# Patient Record
Sex: Male | Born: 1965 | Race: White | Hispanic: No | Marital: Married
Health system: Midwestern US, Academic
[De-identification: ages and names within clinical notes are randomized; demographics above are authoritative.]

---

## 2016-12-12 MED ORDER — SODIUM CHLORIDE 0.9 % IV SOLP
250 mL | INTRAVENOUS | 0 refills | Status: AC | PRN
Start: 2016-12-12 — End: ?

## 2016-12-12 MED ORDER — REGADENOSON 0.4 MG/5 ML IV SYRG
.4 mg | Freq: Once | INTRAVENOUS | 0 refills | Status: CP
Start: 2016-12-12 — End: ?

## 2016-12-12 MED ORDER — ALBUTEROL SULFATE 90 MCG/ACTUATION IN HFAA
2 | RESPIRATORY_TRACT | 0 refills | Status: DC | PRN
Start: 2016-12-12 — End: 2016-12-17

## 2016-12-12 MED ORDER — NITROGLYCERIN 0.4 MG SL SUBL
.4 mg | SUBLINGUAL | 0 refills | Status: AC | PRN
Start: 2016-12-12 — End: ?

## 2016-12-12 MED ORDER — AMINOPHYLLINE 500 MG/20 ML IV SOLN
50 mg | INTRAVENOUS | 0 refills | Status: AC | PRN
Start: 2016-12-12 — End: ?

## 2017-04-07 LAB — COMPREHENSIVE METABOLIC PANEL
Lab: 0.4
Lab: 138 — ABNORMAL LOW (ref 4.70–6.10)
Lab: 21
Lab: 22
Lab: 4
Lab: 68
Lab: 7.3
Lab: 72

## 2017-04-07 LAB — THYROID STIMULATING HORMONE-TSH: Lab: 1.2 — ABNORMAL HIGH (ref 30–200)

## 2017-04-07 LAB — LIPID PROFILE
Lab: 23 — ABNORMAL LOW (ref 35–60)
Lab: 38 — ABNORMAL HIGH (ref 27.0–31.0)
Lab: 4
Lab: 45 — ABNORMAL HIGH (ref 5–40)
Lab: 95 — ABNORMAL LOW (ref 150–200)

## 2017-07-13 ENCOUNTER — Encounter: Admit: 2017-07-13 | Discharge: 2017-07-13 | Payer: MEDICARE

## 2017-07-13 ENCOUNTER — Ambulatory Visit: Admit: 2017-07-13 | Discharge: 2017-07-14 | Payer: MEDICARE

## 2017-07-13 DIAGNOSIS — I2510 Atherosclerotic heart disease of native coronary artery without angina pectoris: Principal | ICD-10-CM

## 2017-07-13 DIAGNOSIS — E785 Hyperlipidemia, unspecified: ICD-10-CM

## 2017-07-13 DIAGNOSIS — E349 Endocrine disorder, unspecified: ICD-10-CM

## 2017-07-13 DIAGNOSIS — D499 Neoplasm of unspecified behavior of unspecified site: Principal | ICD-10-CM

## 2017-07-13 DIAGNOSIS — I10 Essential (primary) hypertension: ICD-10-CM

## 2017-07-13 DIAGNOSIS — E039 Hypothyroidism, unspecified: ICD-10-CM

## 2017-07-13 DIAGNOSIS — R9439 Abnormal result of other cardiovascular function study: ICD-10-CM

## 2017-07-13 DIAGNOSIS — Z720 Tobacco use: ICD-10-CM

## 2017-12-07 ENCOUNTER — Encounter: Admit: 2017-12-07 | Discharge: 2017-12-07 | Payer: MEDICARE

## 2017-12-07 DIAGNOSIS — E785 Hyperlipidemia, unspecified: ICD-10-CM

## 2017-12-07 DIAGNOSIS — Z720 Tobacco use: ICD-10-CM

## 2017-12-07 DIAGNOSIS — R9439 Abnormal result of other cardiovascular function study: ICD-10-CM

## 2017-12-07 DIAGNOSIS — I2510 Atherosclerotic heart disease of native coronary artery without angina pectoris: ICD-10-CM

## 2017-12-07 DIAGNOSIS — D499 Neoplasm of unspecified behavior of unspecified site: Principal | ICD-10-CM

## 2017-12-07 DIAGNOSIS — I10 Essential (primary) hypertension: ICD-10-CM

## 2017-12-07 DIAGNOSIS — E349 Endocrine disorder, unspecified: ICD-10-CM

## 2017-12-07 DIAGNOSIS — E039 Hypothyroidism, unspecified: ICD-10-CM

## 2018-03-13 LAB — LIPID PROFILE
Lab: 112 — ABNORMAL LOW (ref 150–200)
Lab: 406 — ABNORMAL HIGH (ref 30–200)

## 2018-03-13 LAB — CBC: Lab: 11 — ABNORMAL HIGH (ref 4.8–10.8)

## 2018-03-13 LAB — HEMOGLOBIN A1C: Lab: 8.3 — ABNORMAL HIGH (ref 4.5–6.5)

## 2018-03-13 LAB — COMPREHENSIVE METABOLIC PANEL: Lab: 137 — ABNORMAL LOW (ref 4.70–6.10)

## 2018-06-21 ENCOUNTER — Encounter: Admit: 2018-06-21 | Discharge: 2018-06-21 | Payer: MEDICARE

## 2018-06-21 ENCOUNTER — Ambulatory Visit: Admit: 2018-06-21 | Discharge: 2018-06-22 | Payer: MEDICARE

## 2018-06-21 DIAGNOSIS — I2510 Atherosclerotic heart disease of native coronary artery without angina pectoris: Principal | ICD-10-CM

## 2018-06-21 DIAGNOSIS — I10 Essential (primary) hypertension: ICD-10-CM

## 2018-06-21 DIAGNOSIS — E039 Hypothyroidism, unspecified: ICD-10-CM

## 2018-06-21 DIAGNOSIS — R9439 Abnormal result of other cardiovascular function study: ICD-10-CM

## 2018-06-21 DIAGNOSIS — E785 Hyperlipidemia, unspecified: ICD-10-CM

## 2018-06-21 DIAGNOSIS — D499 Neoplasm of unspecified behavior of unspecified site: Principal | ICD-10-CM

## 2018-06-21 DIAGNOSIS — Z720 Tobacco use: ICD-10-CM

## 2018-06-21 DIAGNOSIS — E349 Endocrine disorder, unspecified: ICD-10-CM

## 2019-02-05 ENCOUNTER — Ambulatory Visit: Admit: 2019-02-05 | Discharge: 2019-02-06

## 2019-02-05 ENCOUNTER — Encounter: Admit: 2019-02-05 | Discharge: 2019-02-05

## 2019-02-05 DIAGNOSIS — E039 Hypothyroidism, unspecified: Secondary | ICD-10-CM

## 2019-02-05 DIAGNOSIS — Z720 Tobacco use: Secondary | ICD-10-CM

## 2019-02-05 DIAGNOSIS — I2510 Atherosclerotic heart disease of native coronary artery without angina pectoris: Secondary | ICD-10-CM

## 2019-02-05 DIAGNOSIS — E785 Hyperlipidemia, unspecified: Secondary | ICD-10-CM

## 2019-02-05 DIAGNOSIS — E349 Endocrine disorder, unspecified: Secondary | ICD-10-CM

## 2019-02-05 DIAGNOSIS — I10 Essential (primary) hypertension: Secondary | ICD-10-CM

## 2019-02-05 DIAGNOSIS — R9439 Abnormal result of other cardiovascular function study: Secondary | ICD-10-CM

## 2019-02-05 DIAGNOSIS — D499 Neoplasm of unspecified behavior of unspecified site: Secondary | ICD-10-CM

## 2019-02-05 NOTE — Progress Notes
Date of Service: 02/05/2019    Jeremy Burnett is a 53 y.o. male.       HPI     I saw Mr. Pharo regarding his coronary artery disease, previous stent angioplasty.  He feels well at the current time.  He continues to struggle with his morbid obesity, hypertension, and hyperlipidemia.  Today, he is wearing a mask.  His last stent was in 2016, a drug-eluting stent to the right coronary artery.     Otherwise, he tells me he feels very well and is not having symptomatic complaints.  I will see him again in late September.    ([id number])             Vitals:    02/05/19 0830   BP: 110/72   BP Source: Arm, Left Upper   Pulse: 68   SpO2: 98%   Weight: (!) 138.3 kg (305 lb)   Height: 1.753 m (5' 9)   PainSc: Zero     Body mass index is 45.04 kg/m???.     Past Medical History  Patient Active Problem List    Diagnosis Date Noted   ??? Abnormal stress test 06/19/2015   ??? Hypothyroidism 06/19/2015   ??? CAD (coronary artery disease) 06/19/2015   ??? CAD (coronary artery disease), native coronary artery 05/31/2007     06/19/2015 cardiac cath - DES to RCA      ??? Essential hypertension 05/31/2007   ??? Hyperlipidemia 05/31/2007   ??? DM (diabetes mellitus) (HCC) 05/31/2007   ??? Morbid obesity with BMI of 45.0-49.9, adult (HCC) 05/31/2007   ??? Tobacco abuse 05/31/2007         Review of Systems   Constitution: Negative.   HENT: Negative.    Eyes: Negative.    Cardiovascular: Negative.    Respiratory: Negative.    Endocrine: Negative.    Hematologic/Lymphatic: Negative.    Skin: Negative.    Musculoskeletal: Negative.    Gastrointestinal: Negative.    Genitourinary: Negative.    Neurological: Negative.    Psychiatric/Behavioral: Negative.    Allergic/Immunologic: Negative.        Physical Exam  Examination reveals a 53 year old gentleman who is obese.  He is wearing a mask.  His head neck exam is benign.  Cranial nerve exam is negative.  Affect is normal.  Gait is normal.  Muscle strength is normal.  He has a slight limp from his knees.  Otherwise, he is ambulatory.  He does not smell of tobacco today.    ([id number])        Cardiovascular Studies      Problems Addressed Today  No diagnosis found.    Assessment and Plan     In summary, this is a 53 year old gentleman with known coronary artery disease.  He is on Lipitor already.  He is going to have his blood work to follow his liver function tests and lipid profile at that time.  I would like to see him back in September to review his risk factor modification.    ([id number])             Current Medications (including today's revisions)  ??? acetaminophen/codeine (TYLENOL #3) 300/30 mg tablet Take 1 tablet by mouth twice daily. Max of 4,000 mg of acetaminophen in 24 hours.   ??? ammonium lactate(+) (LAC-HYDRIN) 12 % topical cream Apply  topically to affected area twice daily.   ??? aspirin EC 81 mg tablet Take 1 Tab by mouth Every Morning.    ???  atorvastatin (LIPITOR) 40 mg tablet Take 40 mg by mouth daily.   ??? diphenhydrAMINE (BENADRYL) 25 mg capsule Take 2 Caps by mouth At Bedtime as needed For sleep   ??? duloxetine DR (CYMBALTA) 30 mg capsule Take 30 mg by mouth at bedtime daily.   ??? FISH OIL 1,000 mg Cap Take 1 capsule by mouth as Needed.   ??? insulin glargine (LANTUS SOLOSTAR) 100 unit/mL (3 mL) injection PEN Inject 50 Units under the skin every morning.   ??? levothyroxine (SYNTHROID) 25 mcg tablet Take 25 mcg by mouth daily 30 minutes before breakfast.   ??? lisinopril (PRINIVIL; ZESTRIL) 10 mg tablet Take 10 mg by mouth daily.   ??? metFORMIN (GLUCOPHAGE) 1,000 mg tablet Take 1 Tab by mouth three times daily.   ??? metoprolol (LOPRESSOR) 25 mg tablet Take 1 tablet by mouth daily.   ??? nitroglycerin (NITROSTAT) 0.4 mg tablet Place 1 Tab under tongue every 5 minutes as needed for Chest Pain. Max of 3 tablets, call 911.   ??? Potassium Gluconate 595 mg (99 mg) tab Take  by mouth as Needed.   ??? triamterene/hydrochlorothiazide (MAXZIDE) 37.5/25 mg tablet Take 1 Tab by mouth Every Morning.    ??? zolpidem (AMBIEN) 10 mg tablet Take 10 mg by mouth at bedtime as needed for Sleep.

## 2019-05-09 ENCOUNTER — Encounter: Admit: 2019-05-09 | Discharge: 2019-05-09

## 2019-05-14 ENCOUNTER — Encounter: Admit: 2019-05-14 | Discharge: 2019-05-14

## 2019-05-14 DIAGNOSIS — I10 Essential (primary) hypertension: Secondary | ICD-10-CM

## 2019-05-14 DIAGNOSIS — Z720 Tobacco use: Secondary | ICD-10-CM

## 2019-05-14 DIAGNOSIS — I2510 Atherosclerotic heart disease of native coronary artery without angina pectoris: Secondary | ICD-10-CM

## 2019-05-14 DIAGNOSIS — E785 Hyperlipidemia, unspecified: Secondary | ICD-10-CM

## 2019-05-14 DIAGNOSIS — E349 Endocrine disorder, unspecified: Secondary | ICD-10-CM

## 2019-05-14 DIAGNOSIS — D499 Neoplasm of unspecified behavior of unspecified site: Secondary | ICD-10-CM

## 2019-05-14 DIAGNOSIS — R9439 Abnormal result of other cardiovascular function study: Secondary | ICD-10-CM

## 2019-05-14 DIAGNOSIS — E039 Hypothyroidism, unspecified: Secondary | ICD-10-CM

## 2019-06-05 ENCOUNTER — Encounter: Admit: 2019-06-05 | Discharge: 2019-06-05 | Payer: MEDICARE

## 2019-06-05 DIAGNOSIS — D499 Neoplasm of unspecified behavior of unspecified site: Secondary | ICD-10-CM

## 2019-06-05 DIAGNOSIS — E039 Hypothyroidism, unspecified: Secondary | ICD-10-CM

## 2019-06-05 DIAGNOSIS — E349 Endocrine disorder, unspecified: Secondary | ICD-10-CM

## 2019-06-05 DIAGNOSIS — Z720 Tobacco use: Secondary | ICD-10-CM

## 2019-06-05 DIAGNOSIS — I10 Essential (primary) hypertension: Secondary | ICD-10-CM

## 2019-06-05 DIAGNOSIS — R9439 Abnormal result of other cardiovascular function study: Secondary | ICD-10-CM

## 2019-06-05 DIAGNOSIS — I2510 Atherosclerotic heart disease of native coronary artery without angina pectoris: Secondary | ICD-10-CM

## 2019-06-05 DIAGNOSIS — E785 Hyperlipidemia, unspecified: Secondary | ICD-10-CM

## 2019-06-05 DIAGNOSIS — M542 Cervicalgia: Secondary | ICD-10-CM
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
## 2019-06-12 NOTE — Patient Instructions
Notify our office when and where you have completed your MRI C spine.      Lelon Mast RN, BSN  Clinical Nurse Coordinator-[facility] - [facility] - Neurosurgery- Dr. Burman Freestone & Dr Mancel Parsons  Ph: [phone] - Fax: [phone] - Email:[email or web address] Pierini

## 2019-06-13 NOTE — Telephone Encounter
Patient requesting MRI C spine w/o contrast to be done at [facility] faxed to PCP office.

## 2019-06-14 ENCOUNTER — Encounter: Admit: 2019-06-14 | Discharge: 2019-06-14 | Payer: MEDICARE

## 2019-06-14 NOTE — Telephone Encounter
[facility] called [facility] is needing a prior authorization, patient informed nurse is not here today.      MA will route to [facility] for further review.

## 2019-06-26 ENCOUNTER — Ambulatory Visit: Admit: 2019-06-26 | Discharge: 2019-06-26 | Payer: MEDICARE

## 2019-06-26 ENCOUNTER — Encounter: Admit: 2019-06-26 | Discharge: 2019-06-26 | Payer: MEDICARE

## 2019-06-26 DIAGNOSIS — I8390 Asymptomatic varicose veins of unspecified lower extremity: Secondary | ICD-10-CM

## 2019-06-26 DIAGNOSIS — I10 Essential (primary) hypertension: Secondary | ICD-10-CM

## 2019-06-26 DIAGNOSIS — R112 Nausea with vomiting, unspecified: Secondary | ICD-10-CM

## 2019-06-26 DIAGNOSIS — M2550 Pain in unspecified joint: Secondary | ICD-10-CM

## 2019-06-26 DIAGNOSIS — N200 Calculus of kidney: Secondary | ICD-10-CM

## 2019-06-26 DIAGNOSIS — R9439 Abnormal result of other cardiovascular function study: Secondary | ICD-10-CM

## 2019-06-26 DIAGNOSIS — Z720 Tobacco use: Secondary | ICD-10-CM

## 2019-06-26 DIAGNOSIS — E039 Hypothyroidism, unspecified: Secondary | ICD-10-CM

## 2019-06-26 DIAGNOSIS — C801 Malignant (primary) neoplasm, unspecified: Secondary | ICD-10-CM

## 2019-06-26 DIAGNOSIS — G971 Other reaction to spinal and lumbar puncture: Secondary | ICD-10-CM

## 2019-06-26 DIAGNOSIS — E785 Hyperlipidemia, unspecified: Secondary | ICD-10-CM

## 2019-06-26 DIAGNOSIS — N189 Chronic kidney disease, unspecified: Secondary | ICD-10-CM

## 2019-06-26 DIAGNOSIS — I2510 Atherosclerotic heart disease of native coronary artery without angina pectoris: Secondary | ICD-10-CM

## 2019-06-26 DIAGNOSIS — K589 Irritable bowel syndrome without diarrhea: Secondary | ICD-10-CM

## 2019-06-26 DIAGNOSIS — E119 Type 2 diabetes mellitus without complications: Secondary | ICD-10-CM

## 2019-06-26 DIAGNOSIS — I219 Acute myocardial infarction, unspecified: Secondary | ICD-10-CM

## 2019-06-26 DIAGNOSIS — I209 Angina pectoris, unspecified: Secondary | ICD-10-CM

## 2019-06-26 DIAGNOSIS — M47812 Spondylosis without myelopathy or radiculopathy, cervical region: Secondary | ICD-10-CM

## 2019-06-26 DIAGNOSIS — K5732 Diverticulitis of large intestine without perforation or abscess without bleeding: Secondary | ICD-10-CM

## 2019-06-26 DIAGNOSIS — D499 Neoplasm of unspecified behavior of unspecified site: Secondary | ICD-10-CM

## 2019-06-26 DIAGNOSIS — E349 Endocrine disorder, unspecified: Secondary | ICD-10-CM

## 2019-06-26 NOTE — Progress Notes
Date of Service: 06/26/2019     Subjective:               History of Present Illness    Jeremy Burnett is a 53 y.o. male with a history of multiple left shoulder dislocations. He states that it continues to sublux posteriorly frequently. He also endorses numbness in his bilateral 4th and 5th digits for the past 6 months. He feels that his left hand is weaker than the right. He occasionally experiences pain that radiates from his left shoulder down to his left hand which improves with pain medications. He denies lower extremity weakness and numbness. Denies bowel and bladder concerns.       Review of Systems   Constitutional: Negative.    HENT: Negative.    Eyes: Negative.    Respiratory: Negative.    Cardiovascular: Negative.    Gastrointestinal: Negative.    Endocrine: Negative.    Genitourinary: Negative.    Musculoskeletal: Positive for arthralgias and joint swelling.   Skin: Negative.    Allergic/Immunologic: Negative.    Neurological: Negative.    Hematological: Negative.    Psychiatric/Behavioral: Negative.          Objective:         ? acetaminophen/codeine (TYLENOL #3) 300/30 mg tablet Take 1 tablet by mouth twice daily. Max of 4,000 mg of acetaminophen in 24 hours.   ? ammonium lactate(+) (LAC-HYDRIN) 12 % topical cream Apply  topically to affected area twice daily.   ? aspirin EC 81 mg tablet Take 1 Tab by mouth Every Morning.    ? atorvastatin (LIPITOR) 40 mg tablet Take 40 mg by mouth daily.   ? cinnamon bark (CINNAMON PO) Take  by mouth.   ? cyclobenzaprine (FLEXERIL) 10 mg tablet 10 mg.   ? diphenhydrAMINE (BENADRYL) 25 mg capsule Take 2 Caps by mouth At Bedtime as needed For sleep   ? duloxetine DR (CYMBALTA) 30 mg capsule Take 30 mg by mouth at bedtime daily.   ? FISH OIL 1,000 mg Cap Take 1 capsule by mouth as Needed.   ? HYDROcodone/acetaminophen (NORCO) 10/325 mg tablet 1 tablet   ? insulin glargine (LANTUS SOLOSTAR) 100 unit/mL (3 mL) injection PEN Inject 60 Units under the skin every morning.   ? levothyroxine (SYNTHROID) 25 mcg tablet Take 25 mcg by mouth daily 30 minutes before breakfast.   ? lisinopril (PRINIVIL; ZESTRIL) 10 mg tablet Take 10 mg by mouth daily.   ? metFORMIN (GLUCOPHAGE) 1,000 mg tablet Take 1 Tab by mouth three times daily.   ? metoprolol (LOPRESSOR) 25 mg tablet Take 1 tablet by mouth daily.   ? nitroglycerin (NITROSTAT) 0.4 mg tablet Place 1 Tab under tongue every 5 minutes as needed for Chest Pain. Max of 3 tablets, call 911.   ? Potassium Gluconate 595 mg (99 mg) tab Take  by mouth as Needed.   ? triamterene/hydrochlorothiazide (MAXZIDE) 37.5/25 mg tablet Take 1 Tab by mouth Every Morning.    ? zolpidem (AMBIEN) 10 mg tablet Take 10 mg by mouth at bedtime as needed for Sleep.     Vitals:    06/26/19 1004   Weight: (!) 139.7 kg (308 lb)   Height: 175.3 cm (69)   PainSc: Zero     Body mass index is 45.48 kg/m?Marland Kitchen       Physical Exam  Neurologic Exam:  Mental status: awake and alert, appropriate affect  Motor: no pronator drift, motor strength 5/5 at hand  grips, and on flexion and extension at the biceps, triceps, shoulders, hips, knees and ankles.  Sensory: diminished light touch in the bilateral 4th and 5th digits, otherwise intact  Reflexes: deep tendon reflexes are 2+ and equal bilaterally at the biceps, brachioradialis, patellar, and Achilles. No clonus, no Hoffmans         Assessment and Plan:  Jeremy Burnett presents with a chief complaint of shoulder subluxation. His MRI C-spine shows degenerative changes with some foraminal stenosis greater on the right side at C5/6 and C6/7. He does not describe any symptoms of myelopathy or right upper extremity radiculopathy. His left shoulder subluxation is not likely to be caused by any cervical spinal cord or nerve root problems. I do not recommend any spine surgical interventions.                 I personally supervised the resident performing the E/M, I examined the patient, discussed case with resident and patient, and concur with resident documentation of history, physical assessment and treatment plan unless otherwise noted.

## 2019-11-12 ENCOUNTER — Encounter: Admit: 2019-11-12 | Discharge: 2019-11-12 | Payer: MEDICARE

## 2020-03-19 ENCOUNTER — Encounter: Admit: 2020-03-19 | Discharge: 2020-03-19 | Payer: MEDICARE

## 2020-05-27 ENCOUNTER — Encounter: Admit: 2020-05-27 | Discharge: 2020-05-27 | Payer: MEDICARE

## 2020-05-28 ENCOUNTER — Encounter: Admit: 2020-05-28 | Discharge: 2020-05-28 | Payer: MEDICARE

## 2020-05-28 DIAGNOSIS — N200 Calculus of kidney: Secondary | ICD-10-CM

## 2020-05-28 DIAGNOSIS — I10 Essential (primary) hypertension: Secondary | ICD-10-CM

## 2020-05-28 DIAGNOSIS — E119 Type 2 diabetes mellitus without complications: Secondary | ICD-10-CM

## 2020-05-28 DIAGNOSIS — E785 Hyperlipidemia, unspecified: Secondary | ICD-10-CM

## 2020-05-28 DIAGNOSIS — I209 Angina pectoris, unspecified: Secondary | ICD-10-CM

## 2020-05-28 DIAGNOSIS — Z720 Tobacco use: Secondary | ICD-10-CM

## 2020-05-28 DIAGNOSIS — C801 Malignant (primary) neoplasm, unspecified: Secondary | ICD-10-CM

## 2020-05-28 DIAGNOSIS — K589 Irritable bowel syndrome without diarrhea: Secondary | ICD-10-CM

## 2020-05-28 DIAGNOSIS — I2510 Atherosclerotic heart disease of native coronary artery without angina pectoris: Secondary | ICD-10-CM

## 2020-05-28 DIAGNOSIS — R112 Nausea with vomiting, unspecified: Secondary | ICD-10-CM

## 2020-05-28 DIAGNOSIS — I219 Acute myocardial infarction, unspecified: Secondary | ICD-10-CM

## 2020-05-28 DIAGNOSIS — E039 Hypothyroidism, unspecified: Secondary | ICD-10-CM

## 2020-05-28 DIAGNOSIS — K5732 Diverticulitis of large intestine without perforation or abscess without bleeding: Secondary | ICD-10-CM

## 2020-05-28 DIAGNOSIS — I8390 Asymptomatic varicose veins of unspecified lower extremity: Secondary | ICD-10-CM

## 2020-05-28 DIAGNOSIS — R9439 Abnormal result of other cardiovascular function study: Secondary | ICD-10-CM

## 2020-05-28 DIAGNOSIS — E349 Endocrine disorder, unspecified: Secondary | ICD-10-CM

## 2020-05-28 DIAGNOSIS — M2550 Pain in unspecified joint: Secondary | ICD-10-CM

## 2020-05-28 DIAGNOSIS — D499 Neoplasm of unspecified behavior of unspecified site: Secondary | ICD-10-CM

## 2020-05-28 DIAGNOSIS — N189 Chronic kidney disease, unspecified: Secondary | ICD-10-CM

## 2020-05-28 DIAGNOSIS — G971 Other reaction to spinal and lumbar puncture: Secondary | ICD-10-CM

## 2020-11-24 ENCOUNTER — Encounter: Admit: 2020-11-24 | Discharge: 2020-11-24 | Payer: MEDICARE

## 2020-11-24 DIAGNOSIS — G971 Other reaction to spinal and lumbar puncture: Secondary | ICD-10-CM

## 2020-11-24 DIAGNOSIS — M2550 Pain in unspecified joint: Secondary | ICD-10-CM

## 2020-11-24 DIAGNOSIS — K5732 Diverticulitis of large intestine without perforation or abscess without bleeding: Secondary | ICD-10-CM

## 2020-11-24 DIAGNOSIS — D499 Neoplasm of unspecified behavior of unspecified site: Secondary | ICD-10-CM

## 2020-11-24 DIAGNOSIS — R112 Nausea with vomiting, unspecified: Secondary | ICD-10-CM

## 2020-11-24 DIAGNOSIS — I219 Acute myocardial infarction, unspecified: Secondary | ICD-10-CM

## 2020-11-24 DIAGNOSIS — I209 Angina pectoris, unspecified: Secondary | ICD-10-CM

## 2020-11-24 DIAGNOSIS — Z720 Tobacco use: Secondary | ICD-10-CM

## 2020-11-24 DIAGNOSIS — R079 Chest pain, unspecified: Secondary | ICD-10-CM

## 2020-11-24 DIAGNOSIS — I2510 Atherosclerotic heart disease of native coronary artery without angina pectoris: Secondary | ICD-10-CM

## 2020-11-24 DIAGNOSIS — R9439 Abnormal result of other cardiovascular function study: Secondary | ICD-10-CM

## 2020-11-24 DIAGNOSIS — K589 Irritable bowel syndrome without diarrhea: Secondary | ICD-10-CM

## 2020-11-24 DIAGNOSIS — I8390 Asymptomatic varicose veins of unspecified lower extremity: Secondary | ICD-10-CM

## 2020-11-24 DIAGNOSIS — C801 Malignant (primary) neoplasm, unspecified: Secondary | ICD-10-CM

## 2020-11-24 DIAGNOSIS — E785 Hyperlipidemia, unspecified: Secondary | ICD-10-CM

## 2020-11-24 DIAGNOSIS — E039 Hypothyroidism, unspecified: Secondary | ICD-10-CM

## 2020-11-24 DIAGNOSIS — N200 Calculus of kidney: Secondary | ICD-10-CM

## 2020-11-24 DIAGNOSIS — E119 Type 2 diabetes mellitus without complications: Secondary | ICD-10-CM

## 2020-11-24 DIAGNOSIS — I10 Essential (primary) hypertension: Secondary | ICD-10-CM

## 2020-11-24 DIAGNOSIS — E349 Endocrine disorder, unspecified: Secondary | ICD-10-CM

## 2020-11-24 DIAGNOSIS — N189 Chronic kidney disease, unspecified: Secondary | ICD-10-CM

## 2020-11-24 MED ORDER — DOCUSATE SODIUM 100 MG PO CAP
100 mg | Freq: Every day | ORAL | 0 refills | PRN
Start: 2020-11-24 — End: ?

## 2020-11-24 MED ORDER — ASPIRIN 325 MG PO TAB
325 mg | Freq: Once | ORAL | 0 refills
Start: 2020-11-24 — End: ?

## 2020-11-24 MED ORDER — ALUMINUM-MAGNESIUM HYDROXIDE 200-200 MG/5 ML PO SUSP
30 mL | ORAL | 0 refills | PRN
Start: 2020-11-24 — End: ?

## 2020-11-24 MED ORDER — NITROGLYCERIN 0.3 MG SL SUBL
.4 mg | SUBLINGUAL | 0 refills | PRN
Start: 2020-11-24 — End: ?

## 2020-11-24 MED ORDER — TEMAZEPAM 7.5 MG PO CAP
15 mg | Freq: Every evening | ORAL | 0 refills | PRN
Start: 2020-11-24 — End: ?

## 2020-11-24 MED ORDER — ACETAMINOPHEN 325 MG PO TAB
650 mg | ORAL | 0 refills | PRN
Start: 2020-11-24 — End: ?

## 2020-11-24 NOTE — Patient Instructions
CARDIAC CATHETERIZATION   PRE-ADMISSION INSTRUCTIONS    Patient Name: Jeremy Burnett  MRN#: [id number]  Date of Birth: 10-Mar-1966 (54 y.o.)  Today's Date: 11/24/2020    PROCEDURE:  You are scheduled for a Coronary Angiogram with possible Angioplasty/Stenting with Dr. Salley Scarlet Hajj.    PROCEDURE DATE AND ARRIVAL TIME:    Your procedure date is 11/27/20.  You will receive a call from the Cath lab staff between 8:00 a.m. and noon on the business day prior to your procedure to let you know at what time to arrive on the day of your procedure.    Please check in at the Admitting Desk in the [facility] for your procedure. [facility] Entrance and and take a right. Continue down the hallway past the Cardiovascular Medicine office. That hall will take [facility] into the [facility]. Check in at the desk on the left side.)     (If you have further questions regarding your arrival time for the CV lab, please call [phone] by 3:00pm the day before your procedure. Please leave a message with your name and number, your call will be returned in a timely manner.)    PRE-PROCEDURE APPOINTMENTS:             Pre-Admission lab work required within 14 days of procedure: BMP and CBC at the lab of your choice.                 FOOD AND DRINK INSTRUCTIONS  Nothing to eat after midnight before your procedure. No caffeine for 24 hours prior to your procedure. You will be under moderate sedation for your procedure.  You may drink clear liquids up to an hour before hospital arrival. This will be confirmed by the Cath lab staff the day before your procedure.     SPECIAL MEDICATION INSTRUCTIONS  Any new prescriptions will be sent to your pharmacy listed on file with [place].        Please either take 4 baby aspirins (4 times 81mg ) or one full strength NON-COATED 325mg  aspirin.   Other: Hold oral diabetic medications       TAKE AM OF PROCEDURE:  HOLD ALL over the counter vitamins or supplements on the morning of your procedure.      Additional Instructions  If you wear CPAP, please bring your mask and machine with you to the hospital.    Take a bath or shower with anti-bacterial soap the evening before, or the morning of the procedure.     Bring photo ID and your health insurance card(s).    Arrange for a driver to take you home from the hospital. Please arrange for a friend or family member to take you home from this test. You cannot take a Taxi, [employer], or public transportation as there has to be a responsible person to help care for you after sedation    Bring an accurate list of your current medications with you to the hospital (all medications and supplements taken daily).  Please use the medication list below and write in the date and time when you took your last dose before your procedure. Update this list of medications as needed.      Wear comfortable clothes and don't bring valuables, other than photo identification card, with you to the hospital.    Please pack a bag for an overnight stay.     Please review your pre-procedure instructions and bring them with you on the day of your procedure.  Call the office at [phone] with any questions. You may ask to speak with Dr. Laban Emperor nurse.        ALLERGIES  Allergies   Allergen Reactions   ? Ibuprofen SEE COMMENTS     Patient only has one kidney   ? Milk DIARRHEA   ? Nsaids (Non-Steroidal Anti-Inflammatory Drug) SEE COMMENTS     Patient only has one kidney   ? Tramadol HEADACHE       CURRENT MEDICATIONS  Outpatient Encounter Medications as of 11/24/2020   Medication Sig Dispense Refill   ? acetaminophen/codeine (TYLENOL #3) 300/30 mg tablet Take 1 tablet by mouth as Needed. Max of 4,000 mg of acetaminophen in 24 hours.      ? ammonium lactate(+) (LAC-HYDRIN) 12 % topical cream Apply  topically to affected area twice daily.     ? aspirin EC 81 mg tablet Take 1 Tab by mouth Every Morning.      ? atorvastatin (LIPITOR) 40 mg tablet Take 40 mg by mouth daily.     ? cinnamon bark (CINNAMON PO) Take  by mouth daily.     ? cyclobenzaprine (FLEXERIL) 10 mg tablet Take 10 mg by mouth as Needed.     ? diclofenac sodium (VOLTAREN) 1 % topical gel Apply  topically to affected area as Needed.     ? diphenhydrAMINE (BENADRYL) 25 mg capsule Take 2 Caps by mouth At Bedtime as needed For sleep     ? duloxetine DR (CYMBALTA) 30 mg capsule Take 30 mg by mouth at bedtime daily.     ? FISH OIL 1,000 mg Cap Take 1 capsule by mouth as Needed.     ? HYDROcodone/acetaminophen (NORCO) 10/325 mg tablet Take 1 tablet by mouth three times daily       ? insulin glargine (LANTUS SOLOSTAR) 100 unit/mL (3 mL) injection PEN Inject 60 Units under the skin every morning.     ? levothyroxine (SYNTHROID) 25 mcg tablet Take 25 mcg by mouth daily 30 minutes before breakfast.     ? lisinopril (PRINIVIL; ZESTRIL) 10 mg tablet Take 10 mg by mouth daily.     ? metFORMIN (GLUCOPHAGE) 1,000 mg tablet Take 1 Tab by mouth three times daily. 180 Tab 0   ? metoprolol (LOPRESSOR) 25 mg tablet Take 1 tablet by mouth daily.     ? nitroglycerin (NITROSTAT) 0.4 mg tablet Place 1 Tab under tongue every 5 minutes as needed for Chest Pain. Max of 3 tablets, call 911. 25 Tab 3   ? Potassium Gluconate 595 mg (99 mg) tab Take 99 mg by mouth daily.     ? triamterene/hydrochlorothiazide (MAXZIDE) 37.5/25 mg tablet Take 1 Tab by mouth Every Morning.      ? TRULICITY 1.5 mg/0.5 mL injection pen      ? zolpidem (AMBIEN) 10 mg tablet Take 10 mg by mouth at bedtime as needed for Sleep.       No facility-administered encounter medications on file as of 11/24/2020.       _________________________________________  Form completed by: Weston Brass  Date completed: 11/24/20  Method: In person and given to the patient.      Coronary Angiography  Angiography is a special type of moving X-ray that lets your doctor view your coronary arteries to see if the blood vessels to your heart are narrowed or blocked. This test is done when someone is having a heart attack. Or it may be done if symptoms may mean a heart attack.  It also may be done after an abnormal cardiac stress test.   ?Before the procedure   ? Tell your healthcare team what medicines you take and any allergies you may have.  ? Tell your healthcare team if you've had a reaction to contrast dye or have had any kidney problems.  ? Follow any directions you are given for not eating or drinking before surgery.  ? A nurse will place an IV (intravenous) catheter in your vein to give fluids, and medicine to relieve pain and help you feel less anxious.  ? They clean your skin and shave the area where the catheter will be inserted, if needed.    During the procedure   ? You will lie on a table with a portable X-ray machine over you. The team will place a surgical drape over your body. The area where the doctor chooses to insert the catheter will be cleaned. This will be either a wrist or the groin.  ? Your doctor will place a long, thin tube (catheter) inside an artery in your groin or arm and guide it into your heart. You may feel pressure with the insertion of the catheter. A numbing medicine often is injected at the insertion site. This eases discomfort during the procedure.  ? They will inject a contrast dye through the catheter into your blood vessels or heart chambers. You may feel a warm sensation or feeling like you have to urinate when the contrast is injected. This is normal.  ? X-rays are taken to show images of the inside of your heart and coronary arteries.     The catheter can be placed into the groin, arm, or wrist.     After the procedure   ? Your healthcare team will tell you how long to lie down and keep the insertion site still. The amount of time may depend on whether a closure device such as a stitch or collagen plug was used to close the opening made in your artery. The time you must be still may be shorter if one of these devices was used. The amount of time will also depend on if there is any bleeding at the catheter insertion site.  ? If the insertion site was in your groin, you may need to lie down with your leg still for several hours. If the insertion site was in your wrist, a pressure bandage may be put on the site. Or you may have closure device placed on the insertion site. It will be taken off when there is no sign of bleeding. If bleeding occurs, a nurse will put pressure on the area to control it.  ? A nurse will check your blood pressure and the insertion site often. This is to make sure you remain stable after the procedure.  ? You may be asked to drink fluid to help flush the contrast liquid out of your system.  ? Have someone drive you home from the hospital.  ? If your doctor uses angioplasty or a stent to treat a blocked artery, you may stay the night in the hospital. If there are multiple blockages that can't be fixed with a stent or angioplasty, you may need surgery to bypass the blockages. This is called coronary artery bypass graft surgery. Your doctor will explain the results of your test and what treatment options that may be best for you.  ? It?s normal to find a small bruise or lump at the insertion site. The lump may be  the collagen plug or stitch that you feel, or a small bruise. These common side effects should disappear within a few weeks.  ? You will be given instructions by your healthcare team on recovering from the coronary angiography. In general, don't lift anything heavier than a gallon of milk for several days. This gives time for the puncture site in the artery wall to heal. Try not to get the puncture site wet. Don't put it under water. Showers are OK. Don't soak in a bathtub, swimming pool, or hot tub until the skin has healed.    When to call your healthcare provider   Call your healthcare provider right away if you have any of these:   ? Symptoms of infection. These include pain, swelling, redness, bleeding, or drainage at the insertion site.  ? Fever of 100.4?F (38?C) or higher, or as advised by your provider  ? Bleeding, bruising, or a lot of swelling where the catheter was inserted  ? Blood in your urine  ? Black or tarry stools  ? Any unusual bleeding  ? Irregular, very slow, or fast heartbeat  ? Dizziness  Call 911  Call 911if any of these occur:     ? Chest pain  ? Shortness of breath  ? Sudden numbness or weakness in arms, legs, or face, or difficulty speaking  ? The puncture site swells up very fast  ? Bleeding from the puncture site that does not slow down with firm pressure  ? Severe or increasing pain, numbness, coldness, or a bluish color in the leg or arm that held the catheter    [employer] last reviewed this educational content on 10/06/2020    ? 2000-2022 [employer]. All rights reserved. This information is not intended as a substitute for professional medical care. Always follow your healthcare professional's instructions.

## 2020-11-25 ENCOUNTER — Encounter: Admit: 2020-11-25 | Discharge: 2020-11-25 | Payer: MEDICARE

## 2020-11-25 DIAGNOSIS — R079 Chest pain, unspecified: Secondary | ICD-10-CM

## 2020-11-25 DIAGNOSIS — I2510 Atherosclerotic heart disease of native coronary artery without angina pectoris: Secondary | ICD-10-CM

## 2020-11-25 LAB — CBC
Lab: 12 — ABNORMAL HIGH (ref 4.8–10.8)
Lab: 13 — ABNORMAL LOW (ref 14.0–18.0)
Lab: 4.2 — ABNORMAL LOW (ref 4.70–6.10)
Lab: 42 — ABNORMAL HIGH (ref 70–105)
Lab: 99 — ABNORMAL HIGH (ref 80.0–99.0)

## 2020-11-25 LAB — BASIC METABOLIC PANEL
Lab: 105
Lab: 136
Lab: 4.5

## 2020-11-25 NOTE — Progress Notes
website with [employer], [email or web address], confirmed benefits and eligibility:  Current and active since 09/05/2018, the plan will pay 100% of allowable charges.  This plan is a [employer] and development officer [employer] and waives prior [place] for all outpatient procedures, including heart cath [phone].  I verified this online at [employer] website and was notified that prior Josem Kaufmann is not allowed for this procedure.

## 2020-11-27 ENCOUNTER — Encounter: Admit: 2020-11-27 | Discharge: 2020-11-27 | Payer: MEDICARE

## 2020-11-27 DIAGNOSIS — Z720 Tobacco use: Secondary | ICD-10-CM

## 2020-11-27 DIAGNOSIS — M2550 Pain in unspecified joint: Secondary | ICD-10-CM

## 2020-11-27 DIAGNOSIS — E785 Hyperlipidemia, unspecified: Secondary | ICD-10-CM

## 2020-11-27 DIAGNOSIS — N189 Chronic kidney disease, unspecified: Secondary | ICD-10-CM

## 2020-11-27 DIAGNOSIS — D499 Neoplasm of unspecified behavior of unspecified site: Secondary | ICD-10-CM

## 2020-11-27 DIAGNOSIS — I209 Angina pectoris, unspecified: Secondary | ICD-10-CM

## 2020-11-27 DIAGNOSIS — I219 Acute myocardial infarction, unspecified: Secondary | ICD-10-CM

## 2020-11-27 DIAGNOSIS — I2510 Atherosclerotic heart disease of native coronary artery without angina pectoris: Secondary | ICD-10-CM

## 2020-11-27 DIAGNOSIS — G971 Other reaction to spinal and lumbar puncture: Secondary | ICD-10-CM

## 2020-11-27 DIAGNOSIS — N200 Calculus of kidney: Secondary | ICD-10-CM

## 2020-11-27 DIAGNOSIS — R112 Nausea with vomiting, unspecified: Secondary | ICD-10-CM

## 2020-11-27 DIAGNOSIS — R9439 Abnormal result of other cardiovascular function study: Secondary | ICD-10-CM

## 2020-11-27 DIAGNOSIS — C801 Malignant (primary) neoplasm, unspecified: Secondary | ICD-10-CM

## 2020-11-27 DIAGNOSIS — K589 Irritable bowel syndrome without diarrhea: Secondary | ICD-10-CM

## 2020-11-27 DIAGNOSIS — E039 Hypothyroidism, unspecified: Secondary | ICD-10-CM

## 2020-11-27 DIAGNOSIS — I10 Essential (primary) hypertension: Secondary | ICD-10-CM

## 2020-11-27 DIAGNOSIS — E349 Endocrine disorder, unspecified: Secondary | ICD-10-CM

## 2020-11-27 DIAGNOSIS — E119 Type 2 diabetes mellitus without complications: Secondary | ICD-10-CM

## 2020-11-27 DIAGNOSIS — K5732 Diverticulitis of large intestine without perforation or abscess without bleeding: Secondary | ICD-10-CM

## 2020-11-27 DIAGNOSIS — I8390 Asymptomatic varicose veins of unspecified lower extremity: Secondary | ICD-10-CM

## 2020-11-27 MED ADMIN — HYDROCODONE-ACETAMINOPHEN 10-325 MG PO TAB [28384]: 1 | ORAL | @ 20:00:00 | Stop: 2020-11-28 | NDC 00904682561

## 2020-11-27 MED ADMIN — SODIUM CHLORIDE 0.9 % IV SOLP [27838]: 500 mL | INTRAVENOUS | @ 14:00:00 | Stop: 2020-11-27 | NDC 00338004904

## 2020-11-27 MED ADMIN — SODIUM CHLORIDE 0.9 % IV SOLP [27838]: 1000 mL | INTRAVENOUS | @ 14:00:00 | Stop: 2020-11-28 | NDC 00338004904

## 2021-01-05 ENCOUNTER — Encounter: Admit: 2021-01-05 | Discharge: 2021-01-05 | Payer: MEDICARE

## 2021-01-05 DIAGNOSIS — I8390 Asymptomatic varicose veins of unspecified lower extremity: Secondary | ICD-10-CM

## 2021-01-05 DIAGNOSIS — C801 Malignant (primary) neoplasm, unspecified: Secondary | ICD-10-CM

## 2021-01-05 DIAGNOSIS — E349 Endocrine disorder, unspecified: Secondary | ICD-10-CM

## 2021-01-05 DIAGNOSIS — N200 Calculus of kidney: Secondary | ICD-10-CM

## 2021-01-05 DIAGNOSIS — I219 Acute myocardial infarction, unspecified: Secondary | ICD-10-CM

## 2021-01-05 DIAGNOSIS — E039 Hypothyroidism, unspecified: Secondary | ICD-10-CM

## 2021-01-05 DIAGNOSIS — R112 Nausea with vomiting, unspecified: Secondary | ICD-10-CM

## 2021-01-05 DIAGNOSIS — G971 Other reaction to spinal and lumbar puncture: Secondary | ICD-10-CM

## 2021-01-05 DIAGNOSIS — M2550 Pain in unspecified joint: Secondary | ICD-10-CM

## 2021-01-05 DIAGNOSIS — I2510 Atherosclerotic heart disease of native coronary artery without angina pectoris: Secondary | ICD-10-CM

## 2021-01-05 DIAGNOSIS — E785 Hyperlipidemia, unspecified: Secondary | ICD-10-CM

## 2021-01-05 DIAGNOSIS — Z720 Tobacco use: Secondary | ICD-10-CM

## 2021-01-05 DIAGNOSIS — I10 Essential (primary) hypertension: Secondary | ICD-10-CM

## 2021-01-05 DIAGNOSIS — D499 Neoplasm of unspecified behavior of unspecified site: Secondary | ICD-10-CM

## 2021-01-05 DIAGNOSIS — I209 Angina pectoris, unspecified: Secondary | ICD-10-CM

## 2021-01-05 DIAGNOSIS — K5732 Diverticulitis of large intestine without perforation or abscess without bleeding: Secondary | ICD-10-CM

## 2021-01-05 DIAGNOSIS — R9439 Abnormal result of other cardiovascular function study: Secondary | ICD-10-CM

## 2021-01-05 DIAGNOSIS — E119 Type 2 diabetes mellitus without complications: Secondary | ICD-10-CM

## 2021-01-05 DIAGNOSIS — K589 Irritable bowel syndrome without diarrhea: Secondary | ICD-10-CM

## 2021-01-05 DIAGNOSIS — N189 Chronic kidney disease, unspecified: Secondary | ICD-10-CM

## 2021-04-20 ENCOUNTER — Encounter: Admit: 2021-04-20 | Discharge: 2021-04-20 | Payer: MEDICARE

## 2021-04-29 ENCOUNTER — Encounter: Admit: 2021-04-29 | Discharge: 2021-04-29 | Payer: MEDICARE

## 2022-05-03 ENCOUNTER — Encounter: Admit: 2022-05-03 | Discharge: 2022-05-03 | Payer: MEDICARE

## 2022-06-11 IMAGING — CT STONE PROTOCOL(Adult)
2 of 3 series · 15 of 46 positions shown, 17 images · non-contrast
Comparison: none

[Series 2: abdomen ax 2.00 br40 s3 · axial · 0.78mm/px · z∈[+1093,+1482]mm · 12 of 225 slices shown, 14 images]
[im 15/225  soft-tissue]
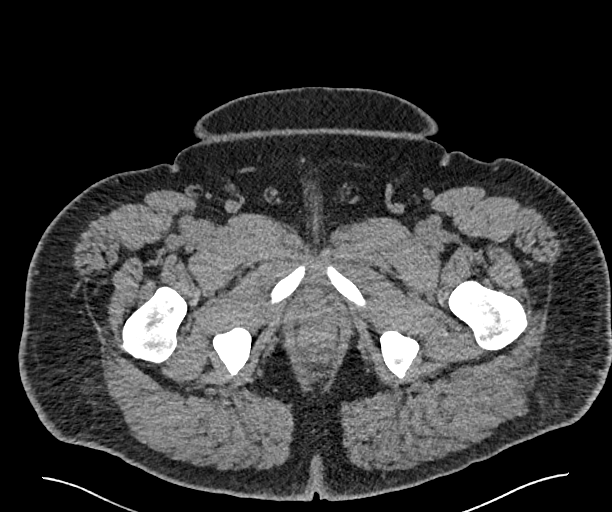
[im 15/225  bone]
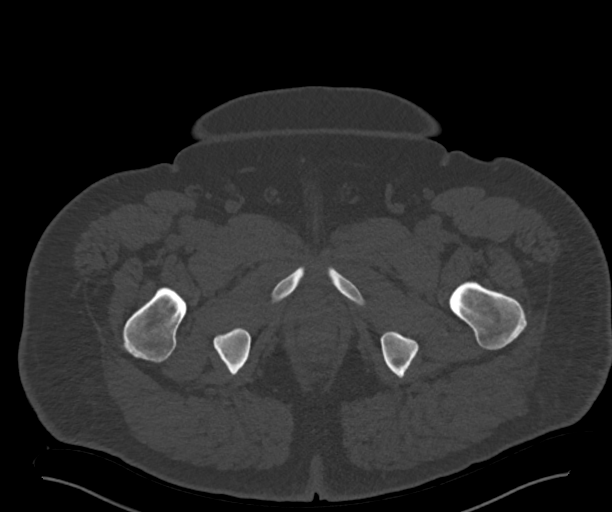
[im 29/225  soft-tissue]
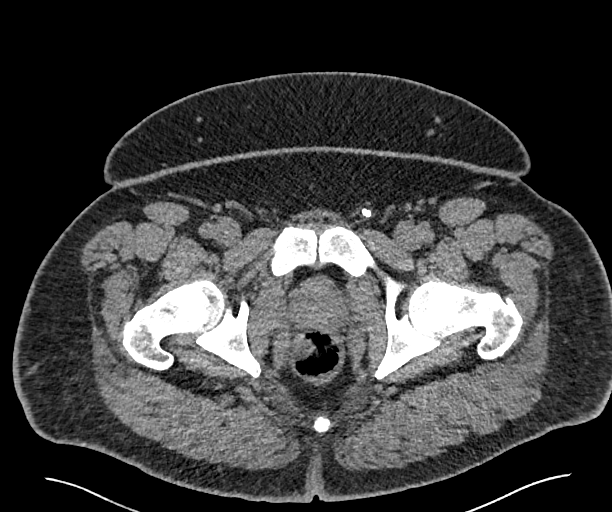
[im 51/225  soft-tissue]
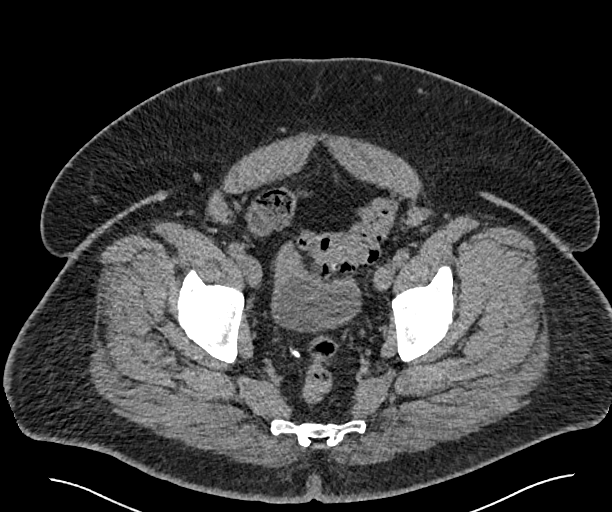
[im 66/225  soft-tissue]
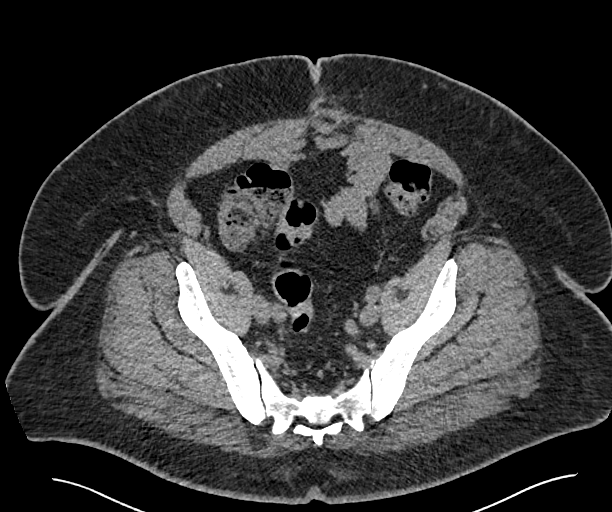
[im 87/225  soft-tissue]
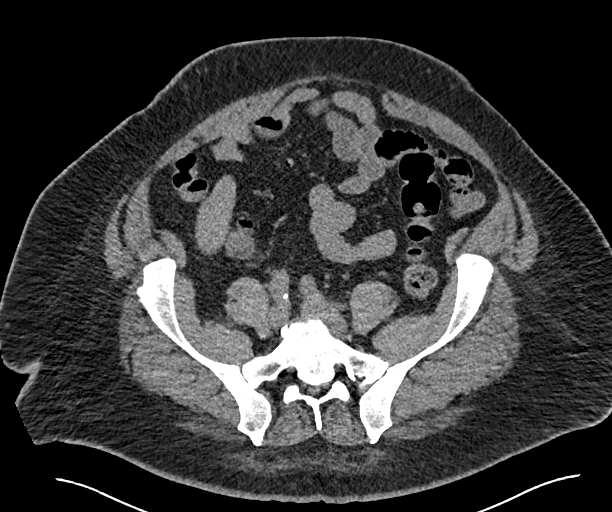
[im 102/225  soft-tissue]
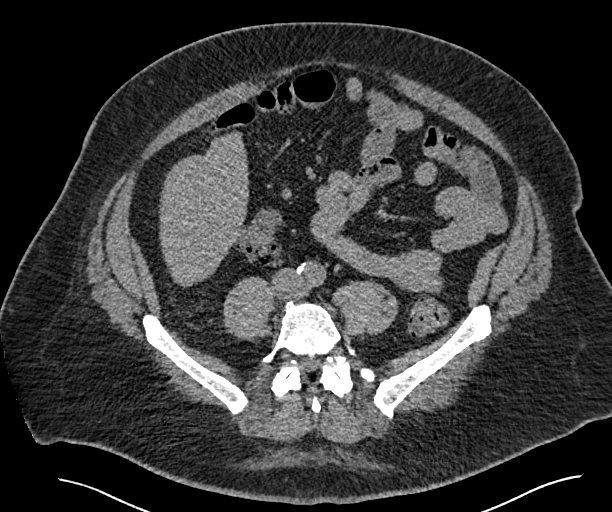
[im 123/225  soft-tissue]
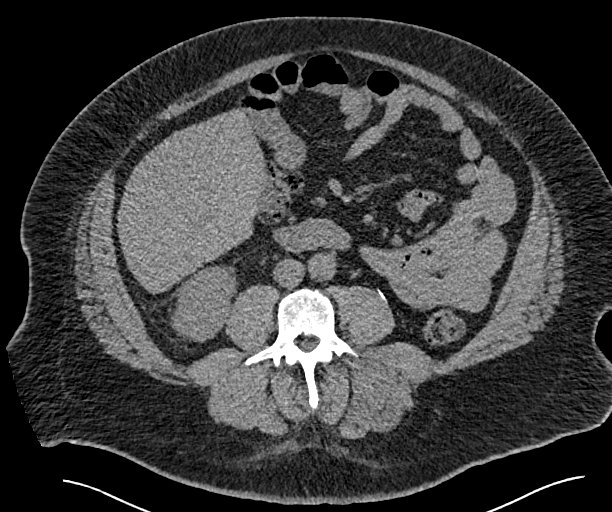
[im 138/225  soft-tissue]
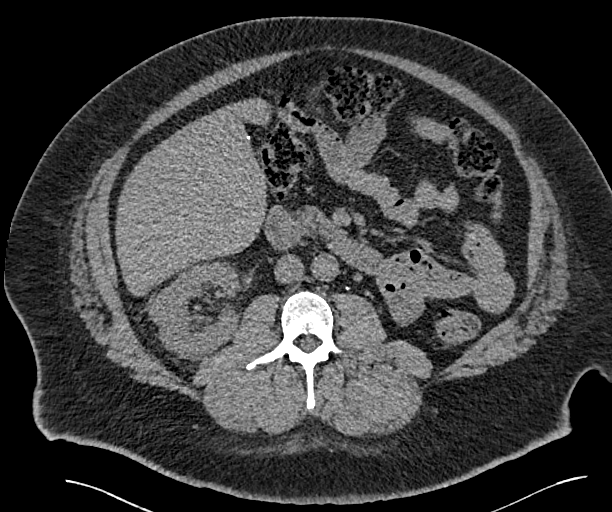
[im 159/225  soft-tissue]
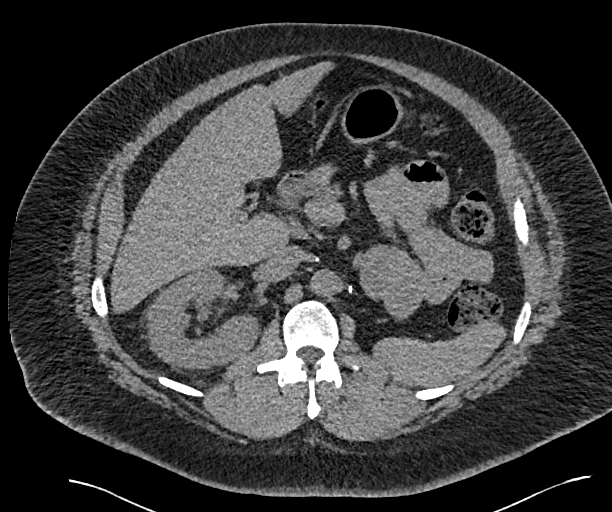
[im 159/225  bone]
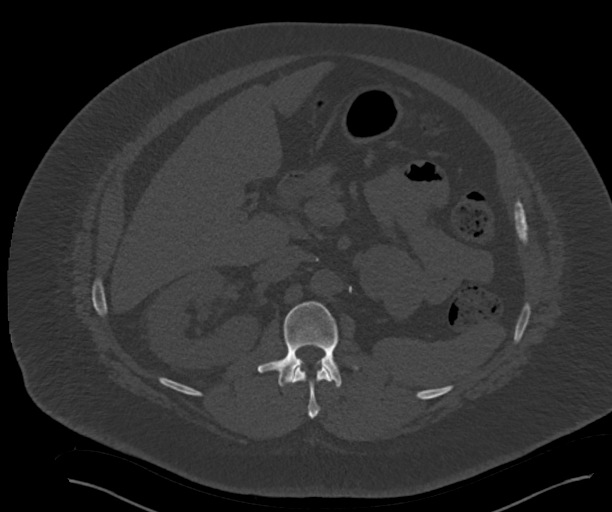
[im 174/225  soft-tissue]
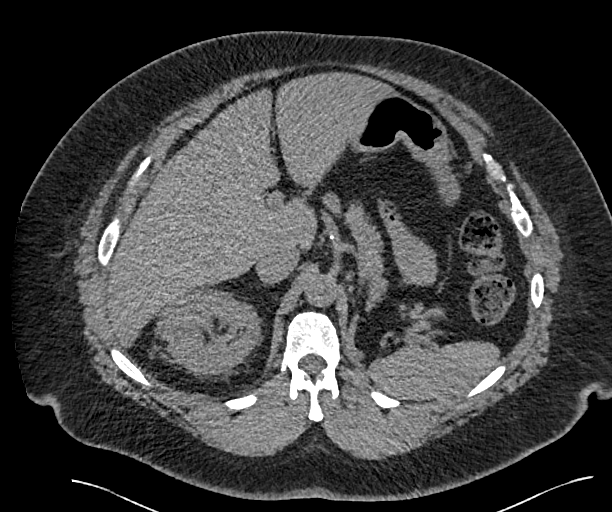
[im 196/225  soft-tissue]
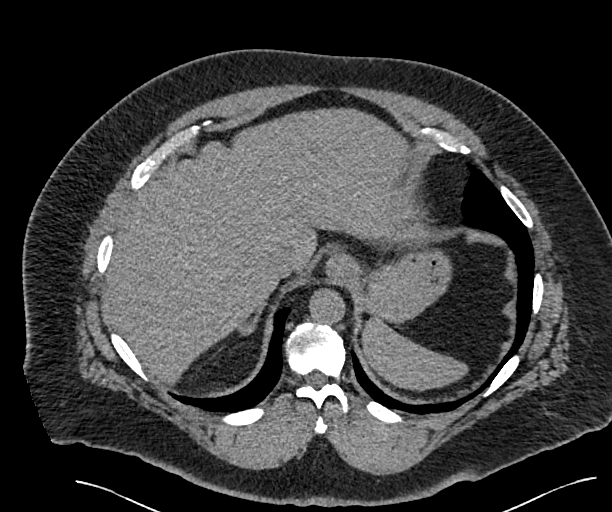
[im 210/225  soft-tissue]
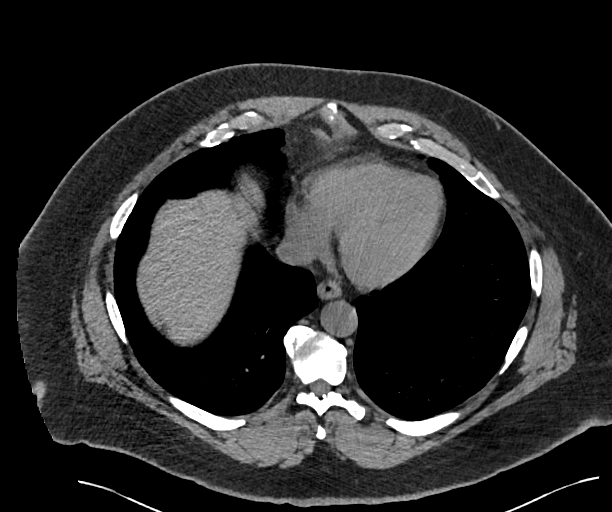

[Series 4: abdomen cor 2.00 br40 s3 · coronal · 0.88mm/px · 3 of 199 slices shown]
[im 67/199  soft-tissue]
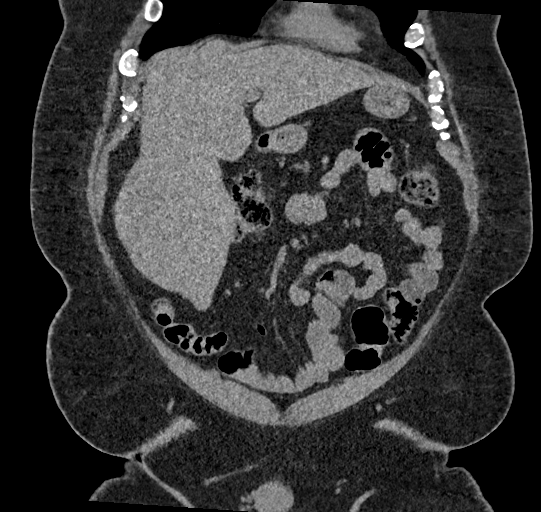
[im 89/199  soft-tissue]
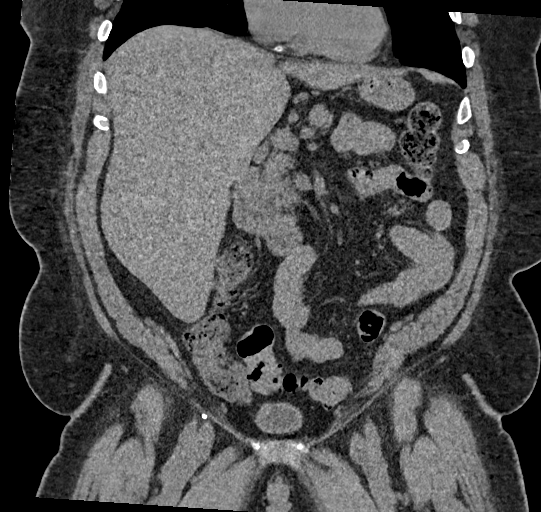
[im 111/199  soft-tissue]
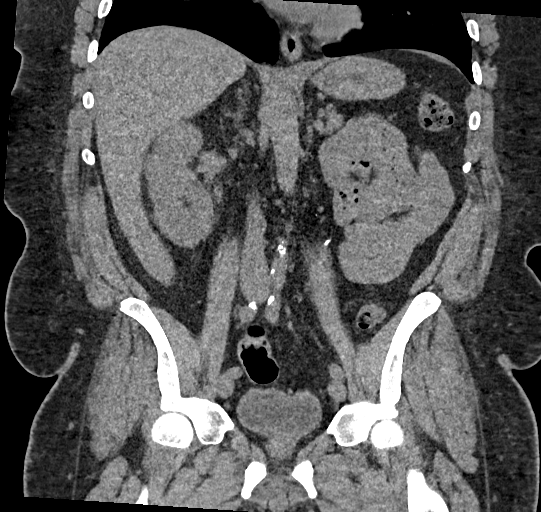

[15 of 46 positions shown; findings below may reference images not displayed]

DIAGNOSTIC STUDIES

EXAM

CT abdomen pelvis without contrast.

INDICATION

rt flank pain, hx of stone

TECHNIQUE

All CT scans at this facility use dose modulation, iterative reconstruction, and/or weight based
dosing when appropriate to reduce radiation dose to as low as reasonably achievable.

Number of previous computed tomography exams in the last 12 months is 3.

Number of previous nuclear medicine myocardial perfusion studies in the last 12 months is 0  .

COMPARISONS

October 23, 2017

FINDINGS

Lung bases are clear. Calcification of the coronary vessels is evident. There is mild
hepatomegaly. Prior cholecystectomy is noted. Spleen is normal.

Right adrenal gland is normal. Patient is status post left nephrectomy. No recurrent or residual
mass is seen in the left renal fossa.

There is perinephric stranding of the right kidney. No hydronephrosis or right renal calculi are
seen. No ureteral calculi are evident.

Pancreas is normal. No retroperitoneal adenopathy. There is minimal calcified plaque throughout the
abdominal pelvic vasculature.

Small and large bowel are normal in caliber. There is colonic diverticulosis without
diverticulitis. Normal appendix. No ascites or mesenteric adenopathy.

No pelvic adenopathy. No bladder calculi are seen.

Osseous structures are unchanged.

IMPRESSION

Prior left nephrectomy. There is mild perinephric stranding surrounding the right kidney.
Urinalysis to exclude infection would be beneficial. No right renal calculi or hydronephrosis is
evident. No right ureteral calculi are seen.

Tech Notes:

RT FLANK PAIN X 12 DAYS. LT NEPHRECTEMY. CT/NM 2/0.
HB

## 2022-08-12 ENCOUNTER — Encounter: Admit: 2022-08-12 | Discharge: 2022-08-12 | Payer: MEDICARE

## 2022-08-16 ENCOUNTER — Encounter: Admit: 2022-08-16 | Discharge: 2022-08-16 | Payer: MEDICARE

## 2022-08-16 NOTE — Progress Notes
Date of Service: 08/17/2022    Jeremy Burnett. is a 56 y.o. male.       HPI   I had the pleasure of seeing [name]. In the office today for cardiovascular follow-up. He is followed by Dr Geronimo Boot.    [name] is a 56 year old male with medical history including coronary artery disease post 4.0 sinus drug-eluting stent to the mid right coronary dominant vessel in 2016, systemic hypertension, hyperlipidemia, diabetes mellitus type II, obesity, current smoker, hypothyroidism, and spondylosis of the cervical region.     His most recent office visit with Dr. Geronimo Boot was on 01/05/2021. At that visit, no changes to his medication regimen were made. He reported improvement in his chest pain and had recently stopped smoking.     Today, [name] presents to the office accompanied by his wife, Jeremy Burnett.  He reports he initially stopped smoking in May 2022 for about 1 month and restarted smoking in June 2022.  He is smoking about 1 pack/day.  He does not have any significant cardiac symptoms today. He denies chest pain or palpitations. He denies shortness of breath, orthopnea, or paroxysmal nocturnal dyspnea.  He reports he frequently awakens during the night, he does snore.  He tells a sleep study and CPAP machine or too expensive with his previous insurance. He reports some lower extremity edema.  He does not wear compression socks.  He denies dizziness, lightheadedness, syncope or near syncope. He denies symptoms of transient ischemic attack or stroke.     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  He had an incident with a bull in October 2023 and was evaluated in nearby emergency department, he has these records with him today and we will scanned them into his chart.  He reportedly did not have any resulting fractures from this incident.    He admits to fast food intake and red meat.  He enjoys eating fresh vegetables during the summer when he can garden.  We discussed in normal days diet, he tells me yesterday he had oatmeal for breakfast, did not eat lunch, and had chicken with noodles and a grilled cheese sandwich for dinner.           Vitals:    08/17/22 1413   BP: 118/78   BP Source: Arm, Left Upper   Pulse: 93   SpO2: 96%   O2 Device: None (Room air)   PainSc: Zero   Weight: (!) 143.7 kg (316 lb 12.8 oz)   Height: 175.3 cm (5' 9)     Body mass index is 46.78 kg/m?Marland Kitchen     Past Medical History  Patient Active Problem List    Diagnosis Date Noted    Spondylosis of cervical region without myelopathy or radiculopathy 06/26/2019    Cervical spondylosis with myelopathy 06/12/2019    Abnormal stress test 06/19/2015    Hypothyroidism 06/19/2015    CAD (coronary artery disease) 06/19/2015    CAD (coronary artery disease), native coronary artery 05/31/2007     06/19/2015 cardiac cath - DES to RCA       Essential hypertension 05/31/2007    Hyperlipidemia 05/31/2007    DM (diabetes mellitus) (HCC) 05/31/2007    Morbid obesity with BMI of 45.0-49.9, adult (HCC) 05/31/2007    Tobacco abuse 05/31/2007  Review of Systems   Cardiovascular:  Positive for leg swelling.   All other systems reviewed and are negative.      Physical Exam  General Appearance: no acute distress  Skin: warm & intact  HEENT: unremarkable  Chest Inspection: chest is normal in appearance  Auscultation/Percussion: lungs clear to auscultation, no rales, rhonchi, or wheezing  Cardiac Rhythm: regular rhythm & normal rate  Cardiac Auscultation: Normal S1 & S2, no S3 or S4, no rub  Murmurs: no cardiac murmurs   Extremities: Mild lower extremity edema; 2+ symmetric distal pulses  Abdominal Exam: soft, obese, non-tender, no masses, bowel sounds normal  Liver & Spleen: no organomegaly  Neurologic Exam: oriented to time, place and person; no focal neurologic deficits  Psychiatric: Normal mood and affect.  Behavior is normal. Judgment and thought content normal.     Cardiovascular Studies  Preliminary ECG today demonstrates sinus rhythm with ventricular rate 93 bpm.    Left heart catheterization with selective coronary angiography 11/27/2020:  CORONARY ANGIOGRAPHY:    Coronary anatomy is right dominant.  Left main coronary artery has mild disease.  The LAD is a large vessel and has mild disease in its proximal portion.  There is 30% to 40% plaque at the bifurcation with 1st diagonal artery that appears unchanged from angiogram from 2016.  The mid and distal LAD after that have mild disease.  Diagonal artery has mild disease.  Left circumflex artery is a large nondominant system that has mild disease.  Marginal branch has mild disease.  The right coronary artery is a large and dominant vessel.  It has mild disease in the proximal portion.  It has a widely patent stent in its midportion with no significant in-stent restenosis.  The distal RCA has mild disease.  The RPDA and RPLV are moderate in size and have mild disease.     CONCLUSIONS:    Upper limit of normal left ventricular filling pressure and normal central aortic pressure.     Mild-to-moderate nonobstructive coronary artery disease with widely patent right coronary artery stent with otherwise anatomy unchanged from angiogram in 2016.     Echocardiogram 06/10/15:  Very poor image quality, assessment of LV function is an estimate  Mild right atrial enlargement (may be upper range of normal for age and BSA)  Left ventricular ejection fraction = 45%  Unremarkable cardiac valve structures and function  No significant pericardial effusion  Unable to calculate pulmonary artery pressure   No prior study is available for comparison.    Cardiovascular Health Factors  Vitals BP Readings from Last 3 Encounters:   01/05/21 116/80   11/27/20 (!) 146/79   11/24/20 138/74     Wt Readings from Last 3 Encounters:   01/05/21 (!) 141.1 kg (311 lb)   11/27/20 (!) 148.6 kg (327 lb 9.7 oz)   11/24/20 (!) 140.3 kg (309 lb 3.2 oz)     BMI Readings from Last 3 Encounters:   01/05/21 45.93 kg/m?   11/27/20 48.38 kg/m?   11/24/20 45.66 kg/m?      Smoking Social History     Tobacco Use   Smoking Status Former    Packs/day: 1.00    Years: 31.00    Additional pack years: 0.00    Total pack years: 31.00    Types: Cigarettes    Quit date: 12/15/2020    Years since quitting: 1.6   Smokeless Tobacco Never      Lipid Profile Cholesterol   Date Value Ref Range  Status   02/22/2022 118  Final     HDL   Date Value Ref Range Status   02/22/2022 23 (L) >=40 Final     LDL   Date Value Ref Range Status   02/22/2022   Final    Unable to calculate due to triglyceride result >400 mg/dL     Triglycerides   Date Value Ref Range Status   02/22/2022 473 (H) <150 Final      Blood Sugar Hemoglobin A1C   Date Value Ref Range Status   05/30/2022 7.8 (H) 4.5 - 6.5 Final     Glucose   Date Value Ref Range Status   02/22/2022 175 (H) 70 - 105 Final   11/27/2020 167 (H) 70 - 100 MG/DL Final   45/40/9811 914 (H) 70 - 105 Final   12/01/2005 144 (H) 70 - 110 MG/DL Final   78/29/5621 308 (H) 70 - 110 MG/DL Final   65/78/4696 295 (H) 70 - 110 MG/DL Final     Glucose, POC   Date Value Ref Range Status   11/27/2020 173 (H) 70 - 100 MG/DL Final   28/41/3244 010 (H) 70 - 100 MG/DL Final   27/25/3664 403 (H) 70 - 100 MG/DL Final          Problems Addressed Today  Encounter Diagnoses   Name Primary?    Coronary artery disease involving native coronary artery of native heart without angina pectoris Yes    Cardiovascular symptoms     Essential hypertension     Mixed hyperlipidemia     Morbid obesity with BMI of 45.0-49.9, adult (HCC)     Tobacco abuse     Coronary artery disease due to lipid rich plaque     Abnormal stress test     Snoring     Hypertriglyceridemia        Assessment and Plan   Coronary artery disease post 4.0 sinus drug-eluting stent to the mid right coronary dominant vessel in 2016  Left heart cath in 2022 demonstrated mild to moderate nonobstructive coronary disease with widely patent right coronary artery stent  Denies ischemic symptoms today  Continues risk factor modification with statin and aspirin    Systemic hypertension  Blood pressure is well controlled, today in the office it is 118/78  Continues on lisinopril 10 mg daily, metoprolol tartrate 25 mg daily    Hyperlipidemia  Moderate hypertriglyceridemia  Most recent LDL 43 in March 2022  Most recent triglyceride level 473 in June 2023  Discussed lifestyle and diet modifications including maintaining a healthy weight and reduction of intake of simple carbohydrates, especially high-glycemic and high-fructose foods and beverages, limiting alcohol intake, and increasing consumption of fish that contain high amounts of omega-3 fatty acids   Will re-check lipid panel in 3 months to assess triglycerides     Diabetes mellitus type II  Historically managed by primary care provider  Most recent A1c 7.8 in September 2023  Continues on long acting insulin, regular insulin, and metformin    Tobacco use  Quit smoking for 1 month in 2022, restarted in June 2022  Smoking 1 pack per day  Encouraged smoking cessation    Snoring  Reports daytime sleepiness and snoring  Repeat sleep study     I have scheduled follow-up with Dr. Geronimo Boot on 12/16/22.     Thank you for the opportunity to participate in the care of Jeremy Burnett., please call with questions or concerns.     [employer]  Mirta Mally, Nurse Practitioner  Department of Cardiovascular Medicine  [facility]           Current Medications (including today's revisions)   acetaminophen/codeine (TYLENOL #3) 300/30 mg tablet Take 1 tablet by mouth as Needed. Max of 4,000 mg of acetaminophen in 24 hours.     ammonium lactate(+) (LAC-HYDRIN) 12 % topical cream Apply  topically to affected area twice daily.    aspirin EC 81 mg tablet Take 1 Tab by mouth Every Morning.     atorvastatin (LIPITOR) 40 mg tablet Take 40 mg by mouth daily.    cinnamon bark (CINNAMON PO) Take  by mouth daily.    cyclobenzaprine (FLEXERIL) 10 mg tablet Take 10 mg by mouth as Needed.    diclofenac sodium (VOLTAREN) 1 % topical gel Apply  topically to affected area as Needed.    diphenhydrAMINE (BENADRYL) 25 mg capsule Take 2 Caps by mouth At Bedtime as needed For sleep    FISH OIL 1,000 mg Cap Take 1 capsule by mouth as Needed.    HYDROcodone/acetaminophen (NORCO) 10/325 mg tablet Take 1 tablet by mouth three times daily      insulin glargine (LANTUS SOLOSTAR) 100 unit/mL (3 mL) injection PEN Inject 60 Units under the skin every morning.    levothyroxine (SYNTHROID) 25 mcg tablet Take 25 mcg by mouth daily 30 minutes before breakfast.    lisinopril (PRINIVIL; ZESTRIL) 10 mg tablet Take 10 mg by mouth daily.    metFORMIN (GLUCOPHAGE) 1,000 mg tablet Take one tablet by mouth three times daily. resume on 11/29/20 with evening dose (Patient taking differently: Take 1,000 mg by mouth twice daily with meals. resume on 11/29/20 with evening dose)    metoprolol (LOPRESSOR) 25 mg tablet Take 1 tablet by mouth daily.    nitroglycerin (NITROSTAT) 0.4 mg tablet Place 1 Tab under tongue every 5 minutes as needed for Chest Pain. Max of 3 tablets, call 911.    Potassium Gluconate 595 mg (99 mg) tab Take 99 mg by mouth daily.    triamterene/hydrochlorothiazide (MAXZIDE) 37.5/25 mg tablet Take 1 Tab by mouth Every Morning.     TRULICITY 1.5 mg/0.5 mL injection pen every 7 days.    zolpidem (AMBIEN) 10 mg tablet Take 10 mg by mouth at bedtime as needed for Sleep.

## 2022-08-17 ENCOUNTER — Encounter: Admit: 2022-08-17 | Discharge: 2022-08-17 | Payer: MEDICARE

## 2022-08-17 ENCOUNTER — Ambulatory Visit: Admit: 2022-08-17 | Discharge: 2022-08-17 | Payer: MEDICARE

## 2022-08-17 DIAGNOSIS — I10 Essential (primary) hypertension: Secondary | ICD-10-CM

## 2022-08-17 DIAGNOSIS — M2550 Pain in unspecified joint: Secondary | ICD-10-CM

## 2022-08-17 DIAGNOSIS — E349 Endocrine disorder, unspecified: Secondary | ICD-10-CM

## 2022-08-17 DIAGNOSIS — E119 Type 2 diabetes mellitus without complications: Secondary | ICD-10-CM

## 2022-08-17 DIAGNOSIS — K589 Irritable bowel syndrome without diarrhea: Secondary | ICD-10-CM

## 2022-08-17 DIAGNOSIS — Z720 Tobacco use: Secondary | ICD-10-CM

## 2022-08-17 DIAGNOSIS — D499 Neoplasm of unspecified behavior of unspecified site: Secondary | ICD-10-CM

## 2022-08-17 DIAGNOSIS — R0683 Snoring: Secondary | ICD-10-CM

## 2022-08-17 DIAGNOSIS — R0989 Other specified symptoms and signs involving the circulatory and respiratory systems: Secondary | ICD-10-CM

## 2022-08-17 DIAGNOSIS — E781 Pure hyperglyceridemia: Secondary | ICD-10-CM

## 2022-08-17 DIAGNOSIS — E782 Mixed hyperlipidemia: Secondary | ICD-10-CM

## 2022-08-17 DIAGNOSIS — G971 Other reaction to spinal and lumbar puncture: Secondary | ICD-10-CM

## 2022-08-17 DIAGNOSIS — C801 Malignant (primary) neoplasm, unspecified: Secondary | ICD-10-CM

## 2022-08-17 DIAGNOSIS — I8390 Asymptomatic varicose veins of unspecified lower extremity: Secondary | ICD-10-CM

## 2022-08-17 DIAGNOSIS — E785 Hyperlipidemia, unspecified: Secondary | ICD-10-CM

## 2022-08-17 DIAGNOSIS — N189 Chronic kidney disease, unspecified: Secondary | ICD-10-CM

## 2022-08-17 DIAGNOSIS — R9439 Abnormal result of other cardiovascular function study: Secondary | ICD-10-CM

## 2022-08-17 DIAGNOSIS — E039 Hypothyroidism, unspecified: Secondary | ICD-10-CM

## 2022-08-17 DIAGNOSIS — I209 Angina pectoris, unspecified: Secondary | ICD-10-CM

## 2022-08-17 DIAGNOSIS — I2510 Atherosclerotic heart disease of native coronary artery without angina pectoris: Secondary | ICD-10-CM

## 2022-08-17 DIAGNOSIS — N200 Calculus of kidney: Secondary | ICD-10-CM

## 2022-08-17 DIAGNOSIS — K5732 Diverticulitis of large intestine without perforation or abscess without bleeding: Secondary | ICD-10-CM

## 2022-08-17 DIAGNOSIS — R112 Nausea with vomiting, unspecified: Secondary | ICD-10-CM

## 2022-08-17 DIAGNOSIS — I219 Acute myocardial infarction, unspecified: Secondary | ICD-10-CM

## 2022-10-24 ENCOUNTER — Encounter: Admit: 2022-10-24 | Discharge: 2022-10-24 | Payer: MEDICARE

## 2022-10-24 DIAGNOSIS — R0681 Apnea, not elsewhere classified: Secondary | ICD-10-CM

## 2022-10-24 DIAGNOSIS — E782 Mixed hyperlipidemia: Secondary | ICD-10-CM

## 2022-10-24 DIAGNOSIS — I10 Essential (primary) hypertension: Secondary | ICD-10-CM

## 2022-10-24 DIAGNOSIS — I2510 Atherosclerotic heart disease of native coronary artery without angina pectoris: Secondary | ICD-10-CM

## 2022-10-24 DIAGNOSIS — R9439 Abnormal result of other cardiovascular function study: Secondary | ICD-10-CM

## 2022-10-24 NOTE — Progress Notes
Sleep study is abnormal.     Sleep study report should be uploaded in EPIC soon, please see the final report for details.

## 2022-11-04 ENCOUNTER — Encounter: Admit: 2022-11-04 | Discharge: 2022-11-04 | Payer: MEDICARE

## 2022-11-09 ENCOUNTER — Encounter: Admit: 2022-11-09 | Discharge: 2022-11-09 | Payer: MEDICARE

## 2022-11-09 DIAGNOSIS — E782 Mixed hyperlipidemia: Secondary | ICD-10-CM

## 2022-11-09 DIAGNOSIS — G971 Other reaction to spinal and lumbar puncture: Secondary | ICD-10-CM

## 2022-11-09 DIAGNOSIS — R112 Nausea with vomiting, unspecified: Secondary | ICD-10-CM

## 2022-11-09 DIAGNOSIS — K5732 Diverticulitis of large intestine without perforation or abscess without bleeding: Secondary | ICD-10-CM

## 2022-11-09 DIAGNOSIS — I10 Essential (primary) hypertension: Secondary | ICD-10-CM

## 2022-11-09 DIAGNOSIS — E785 Hyperlipidemia, unspecified: Secondary | ICD-10-CM

## 2022-11-09 DIAGNOSIS — E039 Hypothyroidism, unspecified: Secondary | ICD-10-CM

## 2022-11-09 DIAGNOSIS — I8390 Asymptomatic varicose veins of unspecified lower extremity: Secondary | ICD-10-CM

## 2022-11-09 DIAGNOSIS — E119 Type 2 diabetes mellitus without complications: Secondary | ICD-10-CM

## 2022-11-09 DIAGNOSIS — I2510 Atherosclerotic heart disease of native coronary artery without angina pectoris: Secondary | ICD-10-CM

## 2022-11-09 DIAGNOSIS — D499 Neoplasm of unspecified behavior of unspecified site: Secondary | ICD-10-CM

## 2022-11-09 DIAGNOSIS — N189 Chronic kidney disease, unspecified: Secondary | ICD-10-CM

## 2022-11-09 DIAGNOSIS — N200 Calculus of kidney: Secondary | ICD-10-CM

## 2022-11-09 DIAGNOSIS — E349 Endocrine disorder, unspecified: Secondary | ICD-10-CM

## 2022-11-09 DIAGNOSIS — I219 Acute myocardial infarction, unspecified: Secondary | ICD-10-CM

## 2022-11-09 DIAGNOSIS — R9439 Abnormal result of other cardiovascular function study: Secondary | ICD-10-CM

## 2022-11-09 DIAGNOSIS — M2550 Pain in unspecified joint: Secondary | ICD-10-CM

## 2022-11-09 DIAGNOSIS — K589 Irritable bowel syndrome without diarrhea: Secondary | ICD-10-CM

## 2022-11-09 DIAGNOSIS — I209 Angina pectoris, unspecified: Secondary | ICD-10-CM

## 2022-11-09 DIAGNOSIS — C801 Malignant (primary) neoplasm, unspecified: Secondary | ICD-10-CM

## 2022-11-09 DIAGNOSIS — Z720 Tobacco use: Secondary | ICD-10-CM

## 2022-11-09 MED ORDER — NITROGLYCERIN 0.2 MG/HR TD PT24
1 | MEDICATED_PATCH | Freq: Every day | TRANSDERMAL | 1 refills | 9.00000 days | Status: AC
Start: 2022-11-09 — End: ?

## 2022-11-09 NOTE — Patient Instructions
Thank you for visiting our office today.    We would like to make the following medication adjustments:      Nitro patch       Otherwise continue the same medications as you have been doing.          We will be pursuing the following tests after your appointment today:       Orders Placed This Encounter    NM PET/CT MYOCARDIAL IMAGING PERFUSION STRESS AND REST    NM PET ABSOLUTE QUANT MYOCARDIAL BLOOD FLOW    NM STRESS ECG    nitroglycerin (NITRO-DUR) 0.2 mg/hr patch     **To schedule PET call [phone]**    Please call us in the meantime with any questions or concerns.        Please allow 5-7 business days for our providers to review your results. All normal results will go to MyChart. If you do not have Mychart, it is strongly recommended to get this so you can easily view all your results. If you do not have mychart, we will attempt to call you once with normal lab and testing results. If we cannot reach you by phone with normal results, we will send you a letter.  If you have not heard the results of your testing after one week please give [place] a call.       Your Cardiovascular Medicine [place] Team Richardson Landry, Rene Kocher, Threasa Beards, and [place])  phone number is [phone].

## 2022-11-23 ENCOUNTER — Encounter: Admit: 2022-11-23 | Discharge: 2022-11-23 | Payer: MEDICARE

## 2022-11-23 MED ORDER — LORAZEPAM 1 MG PO TAB
1 mg | ORAL_TABLET | ORAL | 0 refills | 12.00000 days | Status: AC | PRN
Start: 2022-11-23 — End: ?

## 2022-11-23 NOTE — Telephone Encounter
Spoke with patient regarding the Regadenoson Stress PET/CT test scheduled for Friday, 11/25/22 @ 12pm.    Instructed to check into the IR/Nuclear Medicine Department located on the second floor of [facility] @ 11:00am.     Instructed to have nothing to eat or drink after midnight on Thursday.  It is ok to take prescribed medications with a sip of water on the morning of the test.  Instructed to hold the following medications:  Novolin R, MetFormin, Norco, Vitamins.  He also plans to hold his Lantus the night before.    Patient reported being claustrophobic.  An order was placed for a 1 time dose of PO Ativan 1mg  to be taken at 10:45am on Friday morning with a sip of water.    Instructed to have  no caffeine products, including coffee, tea, soda, chocolate and decaf coffee 24 hours prior to test.    Instructed to wear comfortable clothing and avoid applying lotions and creams to chest.  Patient advised that a PIV will be started and multiple EKG stickers will be applied to chest.    Patient reported that he is only able to raise his right arm above his head.      Patient verbalized understanding of all instructions and denied having any questions at this time.

## 2022-11-25 ENCOUNTER — Encounter: Admit: 2022-11-25 | Discharge: 2022-11-25 | Payer: MEDICARE

## 2022-11-25 ENCOUNTER — Ambulatory Visit: Admit: 2022-11-25 | Discharge: 2022-11-25 | Payer: MEDICARE

## 2022-11-25 DIAGNOSIS — I2510 Atherosclerotic heart disease of native coronary artery without angina pectoris: Secondary | ICD-10-CM

## 2022-11-25 DIAGNOSIS — E782 Mixed hyperlipidemia: Secondary | ICD-10-CM

## 2022-11-25 DIAGNOSIS — I10 Essential (primary) hypertension: Secondary | ICD-10-CM

## 2022-11-25 DIAGNOSIS — R9439 Abnormal result of other cardiovascular function study: Secondary | ICD-10-CM

## 2022-11-25 MED ORDER — AMINOPHYLLINE 25 MG/ML IV SOLN
50 mg | INTRAVENOUS | 0 refills | Status: DC | PRN
Start: 2022-11-25 — End: 2022-11-25

## 2022-11-25 MED ORDER — RP DX N-13 AMMONIA MCI
20 | Freq: Once | INTRAVENOUS | 0 refills | Status: CP
Start: 2022-11-25 — End: ?
  Administered 2022-11-25: 17:00:00 20.5 via INTRAVENOUS

## 2022-11-25 MED ORDER — LORAZEPAM 2 MG/ML IJ SOLN GROUP
1 mg | Freq: Once | INTRAVENOUS | 0 refills | Status: DC
Start: 2022-11-25 — End: 2022-11-25

## 2022-11-25 MED ORDER — REGADENOSON 0.4 MG/5 ML IV SYRG
.4 mg | Freq: Once | INTRAVENOUS | 0 refills | Status: CP
Start: 2022-11-25 — End: ?
  Administered 2022-11-25: 17:00:00 0.4 mg via INTRAVENOUS

## 2022-11-25 MED ORDER — SODIUM CHLORIDE 0.9 % IV SOLP
250 mL | INTRAVENOUS | 0 refills | Status: DC | PRN
Start: 2022-11-25 — End: 2022-11-25

## 2022-11-25 MED ORDER — ALBUTEROL SULFATE 90 MCG/ACTUATION IN HFAA
2 | RESPIRATORY_TRACT | 0 refills | Status: DC | PRN
Start: 2022-11-25 — End: 2022-11-25

## 2022-11-25 MED ORDER — EUCALYPTUS-MENTHOL MM LOZG
1 | Freq: Once | ORAL | 0 refills | Status: DC | PRN
Start: 2022-11-25 — End: 2022-11-25

## 2022-11-25 MED ORDER — NITROGLYCERIN 0.4 MG SL SUBL
.4 mg | SUBLINGUAL | 0 refills | Status: DC | PRN
Start: 2022-11-25 — End: 2022-11-25

## 2022-11-26 ENCOUNTER — Encounter: Admit: 2022-11-26 | Discharge: 2022-11-26 | Payer: MEDICARE

## 2022-11-28 ENCOUNTER — Encounter: Admit: 2022-11-28 | Discharge: 2022-11-28 | Payer: MEDICARE

## 2022-11-28 DIAGNOSIS — I2510 Atherosclerotic heart disease of native coronary artery without angina pectoris: Secondary | ICD-10-CM

## 2022-11-28 DIAGNOSIS — E782 Mixed hyperlipidemia: Secondary | ICD-10-CM

## 2022-11-28 DIAGNOSIS — R0602 Shortness of breath: Secondary | ICD-10-CM

## 2022-11-28 DIAGNOSIS — I10 Essential (primary) hypertension: Secondary | ICD-10-CM

## 2022-11-28 DIAGNOSIS — R079 Chest pain, unspecified: Secondary | ICD-10-CM

## 2022-11-28 LAB — BASIC METABOLIC PANEL
ANION GAP: 13
BLD UREA NITROGEN: 18
CALCIUM: 9.4
CHLORIDE: 105
CO2: 22
CREATININE: 1.4 — ABNORMAL HIGH (ref 0.72–1.25)
GFR ESTIMATED: 58 — ABNORMAL LOW (ref >59)
GLUCOSE,PANEL: 139 — ABNORMAL HIGH (ref 70–105)
POTASSIUM: 4.8
SODIUM: 140

## 2022-11-28 LAB — TROPONIN-I

## 2022-11-28 LAB — D-DIMER: D-DIMER: 233

## 2022-11-28 NOTE — Telephone Encounter
Discussed with Dr. Aris Georgia. He recommends for patient to have a Chest CT to rule out PE. He would also like a ddimer and troponin.       Discussed with patient. He is agreeable to care plan and has no further questions at this time. Ordered faxed to [facility] to be completed.

## 2022-11-28 NOTE — Telephone Encounter
Called patient with results and recommendations. Patient states that he is still experiencing chest pain radiating to back, palpitations, and shortness of breath with exertion almost daily. He states that he has some improvement with nitro,  but symptoms do persist. Patient does state that he completed a sleep study that was pretty abnormal and is following with pulmonology for further recommendations.       Will discuss with TLR for recommendations

## 2022-11-28 NOTE — Telephone Encounter
-----   Message from Wynona Meals, RN sent at 11/28/2022  9:05 AM CDT -----    ----- Message -----  From: Shan Levans, MD  Sent: 11/26/2022   7:57 AM CDT  To: Paticia Stack, RN    Let pt know his stress pet was completely normal. I looked myself. Ask him is he still having chest pain and let me know.  If still convincing chest pain then only optio is to do cath.  Tell me what he thinks.

## 2022-11-30 ENCOUNTER — Encounter: Admit: 2022-11-30 | Discharge: 2022-11-30 | Payer: MEDICARE

## 2022-12-01 ENCOUNTER — Encounter: Admit: 2022-12-01 | Discharge: 2022-12-01 | Payer: MEDICARE

## 2022-12-01 DIAGNOSIS — I10 Essential (primary) hypertension: Secondary | ICD-10-CM

## 2022-12-01 DIAGNOSIS — R0609 Other forms of dyspnea: Secondary | ICD-10-CM

## 2022-12-01 DIAGNOSIS — R079 Chest pain, unspecified: Secondary | ICD-10-CM

## 2022-12-01 DIAGNOSIS — I2510 Atherosclerotic heart disease of native coronary artery without angina pectoris: Secondary | ICD-10-CM

## 2022-12-01 DIAGNOSIS — I25119 Atherosclerotic heart disease of native coronary artery with unspecified angina pectoris: Secondary | ICD-10-CM

## 2022-12-01 DIAGNOSIS — I25118 Atherosclerotic heart disease of native coronary artery with other forms of angina pectoris: Secondary | ICD-10-CM

## 2022-12-01 MED ORDER — ASPIRIN 325 MG PO TAB
325 mg | Freq: Once | ORAL | 0 refills
Start: 2022-12-01 — End: ?

## 2022-12-01 NOTE — Telephone Encounter
Discussed lab results and CTA Chest with Dr. Aris Georgia. He recommends for patient to proceed with LHC.         Called and discussed with patient. He is agreeable to care plan and has no further questions at this time.

## 2022-12-01 NOTE — Patient Instructions
CARDIAC CATHETERIZATION   PRE-ADMISSION INSTRUCTIONS    Patient Name: Jeremy Burnett.  MRN#: [id number]  Date of Birth: [birth year] (57 y.o.)  Today's Date: 12/01/2022    PROCEDURE:  You are scheduled for a Coronary Angiogram with possible Angioplasty/Stenting with Dr. Greig Castilla.    PROCEDURE DATE AND ARRIVAL TIME:  Your procedure date is 12/16/22.  You will receive a call from the Cath lab staff between 8:00 a.m. and noon on the business day prior to your procedure to let you know at what time to arrive on the day of your procedure.    Please check in at the Admitting Desk in the [facility] for your procedure.   Address:  [address], [place], [place] [address]    [facility] Entrance and and take a right. Continue down the hallway past the Cardiovascular Medicine office. That hall will take you into the [facility]. Check in at the desk on the left side.)     (If you have further questions regarding your arrival time for the CV lab, please call [phone] by 3:00pm the day before your procedure. Please leave a message with your name and number, your call will be returned in a timely manner.)    PRE-PROCEDURE APPOINTMENTS:    12/08/22 at 11:20am   Office visit to update history and physical (requirement within 30 days of procedure)  with  Dr. Harvel Ricks  at Cardiovascular Medicine  [facility].              Pre-Admission lab work required within 14 days of procedure: BMP and CBC at the lab of your choice.         FOOD AND DRINK INSTRUCTIONS  Nothing to eat after midnight before your procedure. No caffeine for 24 hours prior to your procedure. You will be under moderate sedation for your procedure.  You may drink clear liquids up to an hour before hospital arrival. This will be confirmed by the Cath lab staff the day before your procedure.     SPECIAL MEDICATION INSTRUCTIONS  Any new prescriptions will be sent to your pharmacy listed on file with [place].        Please either take 4 baby aspirins (4 times 81mg ) or one full strength NON-COATED 325mg  aspirin.   Diuretics: Maxzide the morning of procedure  Insulin: Hold  Hypoglycemics: metformin (Glucophage) -- hold the morning of your procedure.          HOLD ALL erectile dysfunction medications for 3 days, unless prescribed for pulmonary hypertension.  HOLD ALL over the counter vitamins or supplements on the morning of your procedure.      Additional Instructions  If you wear CPAP, please bring your mask and machine with you to the hospital.    Take a bath or shower with anti-bacterial soap the evening before, or the morning of the procedure.     Bring photo ID and your health insurance card(s).    Arrange for a driver to take you home from the hospital. Please arrange for a friend or family member to take you home from this test. You cannot take a Taxi, [employer]etto Goad, or public transportation as there has to be a responsible person to help care for you after sedation    Bring an accurate list of your current medications with you to the hospital (all medications and supplements taken daily).  Please use the medication list below and write in the date and time when you took your  last dose before your procedure. Update this list of medications as needed.      Wear comfortable clothes and don't bring valuables, other than photo identification card, with you to the hospital.    Please pack a bag for an overnight stay.     Please review your pre-procedure instructions and bring them with you on the day of your procedure.  Call the office at  [phone]  with any questions. You may ask to speak with Dr. Maisie Fus Rosamond's nurse.        ALLERGIES  Allergies   Allergen Reactions    Oxycodone HIVES    Ibuprofen SEE COMMENTS     Patient only has one kidney    Milk DIARRHEA    Nsaids (Non-Steroidal Anti-Inflammatory Drug) SEE COMMENTS     Patient only has one kidney    Tramadol HEADACHE       CURRENT MEDICATIONS  Outpatient Encounter Medications as of 12/01/2022 Medication Sig Dispense Refill    ammonium lactate(+) (LAC-HYDRIN) 12 % topical cream Apply  topically to affected area twice daily.      aspirin EC 81 mg tablet Take one tablet by mouth every morning.      atorvastatin (LIPITOR) 40 mg tablet Take one tablet by mouth daily.      cinnamon bark (CINNAMON PO) Take  by mouth daily.      CoQ10 (Ubiquinol) 200 mg cap Take two capsules by mouth daily.      cyclobenzaprine (FLEXERIL) 10 mg tablet Take one tablet by mouth as Needed.      diclofenac sodium (VOLTAREN) 1 % topical gel Apply  topically to affected area as Needed.      diphenhydrAMINE (BENADRYL) 25 mg capsule Take two capsules by mouth at bedtime as needed. For sleep      duloxetine DR (CYMBALTA) 30 mg capsule Take one capsule by mouth daily.      FISH OIL 1,000 mg Cap Take one capsule by mouth as Needed.      HYDROcodone/acetaminophen (NORCO) 10/325 mg tablet Take one tablet by mouth three times daily.      insulin glargine (LANTUS SOLOSTAR) 100 unit/mL (3 mL) injection PEN Inject sixty Units under the skin every morning.      levothyroxine (SYNTHROID) 25 mcg tablet Take one tablet by mouth daily 30 minutes before breakfast.      lidocaine (LIDODERM) 5 % topical patch Apply one patch topically to affected area as Needed.      lisinopril (PRINIVIL; ZESTRIL) 10 mg tablet Take one tablet by mouth daily.      LORazepam (ATIVAN) 1 mg tablet Take one tablet by mouth every 4 hours as needed for Nausea, Vomiting or Other.... 1 tablet 0    Magnesium 250 mg tab Take one tablet by mouth daily.      metFORMIN (GLUCOPHAGE) 1,000 mg tablet Take one tablet by mouth three times daily. resume on 11/29/20 with evening dose 180 tablet 0    metoprolol (LOPRESSOR) 25 mg tablet Take one tablet by mouth daily.      nitroglycerin (NITRO-DUR) 0.2 mg/hr patch Apply one patch to top of skin as directed daily. 12 hours on, then 12 hours off. 30 patch 1    nitroglycerin (NITROSTAT) 0.4 mg tablet Place 1 Tab under tongue every 5 minutes as needed for Chest Pain. Max of 3 tablets, call 911. 25 Tab 3    NOVOLIN R REGULAR U100 INSULIN 100 unit/mL injection solution Inject thirty Units under the skin twice daily.  Potassium Gluconate 595 mg (99 mg) tab Take 99 mg by mouth daily.      triamterene/hydrochlorothiazide (MAXZIDE) 37.5/25 mg tablet Take one tablet by mouth every morning.      zolpidem (AMBIEN) 10 mg tablet Take one tablet by mouth at bedtime as needed for Sleep.       No facility-administered encounter medications on file as of 12/01/2022.       _________________________________________  Form completed by: Florene Route, RN  Date completed: 12/01/22  Method: Via MyChart. And telephone

## 2022-12-02 ENCOUNTER — Encounter: Admit: 2022-12-02 | Discharge: 2022-12-02 | Payer: MEDICARE

## 2022-12-02 NOTE — Progress Notes
[employer]   Approved valid from 4-12 to 01-15-23  Authorization [id number]  Tracking [id number]    confirmed benefits and eligibility:  Current and active since 09-05-18 no term, $ deductible with required   co-insurance of % to max OOP $3400, then plan will pay 100% of allowable charges.

## 2022-12-07 ENCOUNTER — Encounter: Admit: 2022-12-07 | Discharge: 2022-12-07 | Payer: MEDICARE

## 2022-12-07 DIAGNOSIS — G4733 Obstructive sleep apnea (adult) (pediatric): Secondary | ICD-10-CM

## 2022-12-07 DIAGNOSIS — G4734 Idiopathic sleep related nonobstructive alveolar hypoventilation: Secondary | ICD-10-CM

## 2022-12-07 NOTE — Progress Notes
Placed phone call to pt to discuss Sleep Study results/recommendations.  Pt verbalized understanding and agreed w/ plan. Scheduled pt for CPAP s/u clinic on 01/05/23 with Dr Evonnie Pat.  CPAP order placed per recommendations of the reviewing doctor.

## 2022-12-08 ENCOUNTER — Encounter: Admit: 2022-12-08 | Discharge: 2022-12-08 | Payer: MEDICARE

## 2022-12-08 DIAGNOSIS — N189 Chronic kidney disease, unspecified: Secondary | ICD-10-CM

## 2022-12-08 DIAGNOSIS — I8390 Asymptomatic varicose veins of unspecified lower extremity: Secondary | ICD-10-CM

## 2022-12-08 DIAGNOSIS — M1990 Unspecified osteoarthritis, unspecified site: Secondary | ICD-10-CM

## 2022-12-08 DIAGNOSIS — E785 Hyperlipidemia, unspecified: Secondary | ICD-10-CM

## 2022-12-08 DIAGNOSIS — K219 Gastro-esophageal reflux disease without esophagitis: Secondary | ICD-10-CM

## 2022-12-08 DIAGNOSIS — R55 Syncope and collapse: Secondary | ICD-10-CM

## 2022-12-08 DIAGNOSIS — E349 Endocrine disorder, unspecified: Secondary | ICD-10-CM

## 2022-12-08 DIAGNOSIS — I10 Essential (primary) hypertension: Secondary | ICD-10-CM

## 2022-12-08 DIAGNOSIS — I2510 Atherosclerotic heart disease of native coronary artery without angina pectoris: Secondary | ICD-10-CM

## 2022-12-08 DIAGNOSIS — R112 Nausea with vomiting, unspecified: Secondary | ICD-10-CM

## 2022-12-08 DIAGNOSIS — I25119 Atherosclerotic heart disease of native coronary artery with unspecified angina pectoris: Secondary | ICD-10-CM

## 2022-12-08 DIAGNOSIS — G4733 Obstructive sleep apnea (adult) (pediatric): Secondary | ICD-10-CM

## 2022-12-08 DIAGNOSIS — Z720 Tobacco use: Secondary | ICD-10-CM

## 2022-12-08 DIAGNOSIS — G4730 Sleep apnea, unspecified: Secondary | ICD-10-CM

## 2022-12-08 DIAGNOSIS — K589 Irritable bowel syndrome without diarrhea: Secondary | ICD-10-CM

## 2022-12-08 DIAGNOSIS — Z136 Encounter for screening for cardiovascular disorders: Secondary | ICD-10-CM

## 2022-12-08 DIAGNOSIS — R0602 Shortness of breath: Secondary | ICD-10-CM

## 2022-12-08 DIAGNOSIS — R079 Chest pain, unspecified: Secondary | ICD-10-CM

## 2022-12-08 DIAGNOSIS — R9439 Abnormal result of other cardiovascular function study: Secondary | ICD-10-CM

## 2022-12-08 DIAGNOSIS — E781 Pure hyperglyceridemia: Secondary | ICD-10-CM

## 2022-12-08 DIAGNOSIS — R0609 Other forms of dyspnea: Secondary | ICD-10-CM

## 2022-12-08 DIAGNOSIS — Z9229 Personal history of other drug therapy: Secondary | ICD-10-CM

## 2022-12-08 DIAGNOSIS — D499 Neoplasm of unspecified behavior of unspecified site: Secondary | ICD-10-CM

## 2022-12-08 DIAGNOSIS — M2550 Pain in unspecified joint: Secondary | ICD-10-CM

## 2022-12-08 DIAGNOSIS — R42 Dizziness and giddiness: Secondary | ICD-10-CM

## 2022-12-08 DIAGNOSIS — I219 Acute myocardial infarction, unspecified: Secondary | ICD-10-CM

## 2022-12-08 DIAGNOSIS — G971 Other reaction to spinal and lumbar puncture: Secondary | ICD-10-CM

## 2022-12-08 DIAGNOSIS — M810 Age-related osteoporosis without current pathological fracture: Secondary | ICD-10-CM

## 2022-12-08 DIAGNOSIS — C801 Malignant (primary) neoplasm, unspecified: Secondary | ICD-10-CM

## 2022-12-08 DIAGNOSIS — E039 Hypothyroidism, unspecified: Secondary | ICD-10-CM

## 2022-12-08 DIAGNOSIS — I209 Angina pectoris, unspecified: Secondary | ICD-10-CM

## 2022-12-08 DIAGNOSIS — K5732 Diverticulitis of large intestine without perforation or abscess without bleeding: Secondary | ICD-10-CM

## 2022-12-08 DIAGNOSIS — E119 Type 2 diabetes mellitus without complications: Secondary | ICD-10-CM

## 2022-12-08 DIAGNOSIS — N200 Calculus of kidney: Secondary | ICD-10-CM

## 2022-12-08 DIAGNOSIS — R000 Tachycardia, unspecified: Secondary | ICD-10-CM

## 2022-12-08 LAB — BASIC METABOLIC PANEL
ANION GAP: 14
BLD UREA NITROGEN: 16
CALCIUM: 9.7
CHLORIDE: 105
CO2: 19 — ABNORMAL LOW (ref 22–29)
CREATININE: 1.3 — ABNORMAL HIGH (ref 0.72–1.25)
GLUCOSE,PANEL: 120 — ABNORMAL HIGH (ref 70–105)
POTASSIUM: 4.9
SODIUM: 138

## 2022-12-08 NOTE — Patient Instructions
Heart and circulatory findings are stable  Continue with current medication  Proceed to coronary angiograms and potential intervention is needed  Follow-up with plans for treatment for your obstructive sleep apnea as well.

## 2022-12-08 NOTE — Progress Notes
Date of Service: 12/08/2022    Jeremy Burnett. is a 57 y.o. male.       HPI     The patient, a 57 year old male with a history of metabolic syndrome, coronary artery disease with previous PCI, chronic pain related to peripheral neuropathy and cervical spinal stenosis, and a history of renal cell carcinoma with a previous nephrectomy, presents with ongoing chest pain. Despite previous tests, including a stress test, no obvious cause has been identified. The patient is currently on medication for cholesterol and blood glucose, and these levels have remained stable. The patient also reports heart rate variability and chest discomfort, which occur even during periods of rest.  Did undergo recent stress test that suggested mild ischemia with otherwise stable blood flow.  \  He denies significant bleeding or bruising issues.  Does have the chronic renal sufficiency but that is stable any does have medical therapy prescribed for prior to his coronary angiograms and dye administration.         Vitals:    12/08/22 1119   BP: 122/86   BP Source: Arm, Left Upper   Pulse: 84   SpO2: 97%   O2 Device: None (Room air)   PainSc: Two   Weight: (!) 145.4 kg (320 lb 9.6 oz)   Height: 175.3 cm (5' 9)     Body mass index is 47.34 kg/m?Marland Kitchen     Past Medical History  Patient Active Problem List    Diagnosis Date Noted    Hypertriglyceridemia 08/17/2022    Spondylosis of cervical region without myelopathy or radiculopathy 06/26/2019    Cervical spondylosis with myelopathy 06/12/2019    Abnormal stress test 06/19/2015    Hypothyroidism 06/19/2015    CAD (coronary artery disease) 06/19/2015    CAD (coronary artery disease), native coronary artery 05/31/2007     06/19/2015 cardiac cath - DES to RCA       Essential hypertension 05/31/2007    Hyperlipidemia 05/31/2007    DM (diabetes mellitus) (HCC) 05/31/2007    Morbid obesity with BMI of 45.0-49.9, adult (HCC) 05/31/2007    Tobacco abuse 05/31/2007         Review of Systems   Constitutional: Positive for malaise/fatigue.   HENT: Negative.     Eyes: Negative.    Cardiovascular:  Positive for chest pain, dyspnea on exertion, irregular heartbeat, leg swelling, near-syncope and palpitations.   Respiratory:  Positive for shortness of breath.    Endocrine: Negative.    Hematologic/Lymphatic: Negative.    Skin: Negative.    Musculoskeletal:  Positive for arthritis, back pain, falls, joint pain, muscle cramps, myalgias and neck pain.   Gastrointestinal: Negative.    Genitourinary: Negative.    Neurological:  Positive for dizziness, headaches, light-headedness, numbness, paresthesias and weakness.   Psychiatric/Behavioral: Negative.     Allergic/Immunologic: Negative.        Physical Exam  Morbidly obese, pleasant gentleman in no distress  Pupils are equal reactive without scleral injection  Short neck with large circumference and poorly identified landmarks.  No mass, bruit, or obvious jugular venous abnormalities  Chest is symmetric and lungs are clear to auscultation  Heart S1, S2 that are normal.  No murmur, click, or gallop  Abdomen obese but soft  Pulses are 2+, regular, symmetric bilaterally radial as well as pedal locations  Symmetric peripheral findings with no obvious edema    Cardiovascular Studies      Cardiovascular Health Factors  Vitals BP Readings from Last 3 Encounters:  12/08/22 122/86   11/09/22 124/72   08/17/22 118/78     Wt Readings from Last 3 Encounters:   12/08/22 (!) 145.4 kg (320 lb 9.6 oz)   11/09/22 (!) 145.6 kg (321 lb)   08/17/22 (!) 143.7 kg (316 lb 12.8 oz)     BMI Readings from Last 3 Encounters:   12/08/22 47.34 kg/m?   11/09/22 47.40 kg/m?   08/17/22 46.78 kg/m?      Smoking Social History     Tobacco Use   Smoking Status Every Day    Current packs/day: 0.00    Average packs/day: 1 pack/day for 31.0 years (31.0 ttl pk-yrs)    Types: Cigarettes    Start date: 12/15/1989    Last attempt to quit: 12/15/2020    Years since quitting: 1.9   Smokeless Tobacco Never      Lipid Profile Cholesterol   Date Value Ref Range Status   02/22/2022 118  Final     HDL   Date Value Ref Range Status   02/22/2022 23 (L) >=40 Final     LDL   Date Value Ref Range Status   02/22/2022   Final    Unable to calculate due to triglyceride result >400 mg/dL     Triglycerides   Date Value Ref Range Status   02/22/2022 473 (H) <150 Final      Blood Sugar Hemoglobin A1C   Date Value Ref Range Status   08/22/2022 7.3 (H) 4.5 - 6.5 Final     Glucose   Date Value Ref Range Status   11/28/2022 139 (H) 70 - 105 Final   09/29/2022 228 (H) 70 - 105 Final   02/22/2022 175 (H) 70 - 105 Final   12/01/2005 144 (H) 70 - 110 MG/DL Final   84/13/2440 102 (H) 70 - 110 MG/DL Final   72/53/6644 034 (H) 70 - 110 MG/DL Final     Glucose, POC   Date Value Ref Range Status   11/27/2020 173 (H) 70 - 100 MG/DL Final   74/25/9563 875 (H) 70 - 100 MG/DL Final   64/33/2951 884 (H) 70 - 100 MG/DL Final          Problems Addressed Today  Encounter Diagnoses   Name Primary?    Screening for heart disease Yes    Coronary artery disease involving native coronary artery of native heart with angina pectoris (HCC)     DOE (dyspnea on exertion)     Shortness of breath     Morbid obesity with BMI of 45.0-49.9, adult (HCC)     OSA (obstructive sleep apnea)        Assessment and Plan     Coronary Artery Disease: Recurrent chest pain despite negative stress test. History of PCI. Catheterization planned for 12/16/2022.  - Proceed with catheterization as planned.  - Monitor renal function due to use of dye in procedure and history of nephrectomy.    Sleep Apnea: Severe, untreated. Noted heart rate variability and chest pain at rest, which may be related.  - Initiate CPAP therapy after diagnostic study on 01/05/2023.    Metabolic Syndrome: History of obesity, hypertension, hyperlipidemia with pronounced hypertriglyceridemia.  - Continue current medications and lifestyle modifications.  Ongoing monitoring with adjustments to therapy is in place.    Chronic Pain: Related to peripheral neuropathy and cervical spinal stenosis.  - Continue current pain management regimen.    Renal Cell Carcinoma: History of, with nephrectomy.  - Continue regular follow-up and monitoring.  Current Medications (including today's revisions)   ammonium lactate(+) (LAC-HYDRIN) 12 % topical cream Apply  topically to affected area twice daily.    aspirin EC 81 mg tablet Take one tablet by mouth every morning.    atorvastatin (LIPITOR) 40 mg tablet Take one tablet by mouth daily.    cinnamon bark (CINNAMON PO) Take  by mouth daily.    CoQ10 (Ubiquinol) 200 mg cap Take two capsules by mouth daily.    cyclobenzaprine (FLEXERIL) 10 mg tablet Take one tablet by mouth as Needed.    diclofenac sodium (VOLTAREN) 1 % topical gel Apply  topically to affected area as Needed.    duloxetine DR (CYMBALTA) 30 mg capsule Take one capsule by mouth daily.    FISH OIL 1,000 mg Cap Take one capsule by mouth as Needed.    HYDROcodone/acetaminophen (NORCO) 10/325 mg tablet Take one tablet by mouth three times daily.    insulin glargine (LANTUS SOLOSTAR) 100 unit/mL (3 mL) injection PEN Inject sixty Units under the skin every morning.    levothyroxine (SYNTHROID) 25 mcg tablet Take one tablet by mouth daily 30 minutes before breakfast.    lidocaine (LIDODERM) 5 % topical patch Apply one patch topically to affected area as Needed.    lisinopril (PRINIVIL; ZESTRIL) 10 mg tablet Take one tablet by mouth daily.    Magnesium 250 mg tab Take one tablet by mouth daily.    metFORMIN (GLUCOPHAGE) 1,000 mg tablet Take one tablet by mouth three times daily. resume on 11/29/20 with evening dose    metoprolol (LOPRESSOR) 25 mg tablet Take one tablet by mouth daily.    nitroglycerin (NITRO-DUR) 0.2 mg/hr patch Apply one patch to top of skin as directed daily. 12 hours on, then 12 hours off.    nitroglycerin (NITROSTAT) 0.4 mg tablet Place 1 Tab under tongue every 5 minutes as needed for Chest Pain. Max of 3 tablets, call 911. NOVOLIN R REGULAR U100 INSULIN 100 unit/mL injection solution Inject thirty Units under the skin twice daily.    Potassium Gluconate 595 mg (99 mg) tab Take 99 mg by mouth daily.    triamterene/hydrochlorothiazide (MAXZIDE) 37.5/25 mg tablet Take one tablet by mouth every morning.    zolpidem (AMBIEN) 10 mg tablet Take one tablet by mouth at bedtime as needed for Sleep.

## 2022-12-14 ENCOUNTER — Encounter: Admit: 2022-12-14 | Discharge: 2022-12-14 | Payer: MEDICARE

## 2022-12-14 DIAGNOSIS — R079 Chest pain, unspecified: Secondary | ICD-10-CM

## 2022-12-14 LAB — POC GLUCOSE: POC GLUCOSE: 117 mg/dL — ABNORMAL HIGH (ref 70–100)

## 2022-12-14 MED ORDER — MELATONIN 5 MG PO TAB
5 mg | Freq: Every evening | ORAL | 0 refills | Status: AC | PRN
Start: 2022-12-14 — End: ?

## 2022-12-14 MED ORDER — ZOLPIDEM 5 MG PO TAB
10 mg | Freq: Every evening | ORAL | 0 refills | Status: AC | PRN
Start: 2022-12-14 — End: ?

## 2022-12-14 MED ORDER — METOPROLOL TARTRATE 25 MG PO TAB
25 mg | Freq: Every day | ORAL | 0 refills | Status: AC
Start: 2022-12-14 — End: ?
  Administered 2022-12-15: 14:00:00 25 mg via ORAL

## 2022-12-14 MED ORDER — ACETYLCYSTEINE 600 MG PO CAP
600 mg | Freq: Two times a day (BID) | ORAL | 0 refills
Start: 2022-12-14 — End: ?

## 2022-12-14 MED ORDER — POLYETHYLENE GLYCOL 3350 17 GRAM PO PWPK
1 | Freq: Every day | ORAL | 0 refills | Status: AC | PRN
Start: 2022-12-14 — End: ?

## 2022-12-14 MED ORDER — HYDROCODONE-ACETAMINOPHEN 10-325 MG PO TAB
1 | Freq: Two times a day (BID) | ORAL | 0 refills | Status: AC | PRN
Start: 2022-12-14 — End: ?
  Administered 2022-12-15 – 2022-12-16 (×3): 1 via ORAL

## 2022-12-14 MED ORDER — DULOXETINE 30 MG PO CPDR
30 mg | Freq: Every day | ORAL | 0 refills | Status: AC
Start: 2022-12-14 — End: ?
  Administered 2022-12-15 – 2022-12-16 (×2): 30 mg via ORAL

## 2022-12-14 MED ORDER — LISINOPRIL 10 MG PO TAB
10 mg | Freq: Every day | ORAL | 0 refills | Status: AC
Start: 2022-12-14 — End: ?
  Administered 2022-12-15 – 2022-12-16 (×2): 10 mg via ORAL

## 2022-12-14 MED ORDER — LEVOTHYROXINE 25 MCG PO TAB
25 ug | Freq: Every day | ORAL | 0 refills | Status: AC
Start: 2022-12-14 — End: ?
  Administered 2022-12-15 – 2022-12-16 (×2): 25 ug via ORAL

## 2022-12-14 MED ORDER — INSULIN REGULAR HUMAN 100 UNIT/ML IJ SOLN
30 [IU] | Freq: Two times a day (BID) | SUBCUTANEOUS | 0 refills | Status: AC
Start: 2022-12-14 — End: ?

## 2022-12-14 MED ORDER — ONDANSETRON 4 MG PO TBDI
4 mg | ORAL | 0 refills | Status: AC | PRN
Start: 2022-12-14 — End: ?

## 2022-12-14 MED ORDER — HEPARIN (PORCINE) BOLUS FOR CONTINUOUS INFUSION (BAG) - APTT MAIN
20-40 [IU]/kg | INTRAVENOUS | 0 refills | Status: AC | PRN
Start: 2022-12-14 — End: ?

## 2022-12-14 MED ORDER — ATORVASTATIN 40 MG PO TAB
40 mg | Freq: Every day | ORAL | 0 refills | Status: AC
Start: 2022-12-14 — End: ?
  Administered 2022-12-15 – 2022-12-16 (×2): 40 mg via ORAL

## 2022-12-14 MED ORDER — SENNOSIDES-DOCUSATE SODIUM 8.6-50 MG PO TAB
1 | Freq: Every day | ORAL | 0 refills | Status: AC | PRN
Start: 2022-12-14 — End: ?

## 2022-12-14 MED ORDER — TRIAMTERENE-HYDROCHLOROTHIAZID 37.5-25 MG PO TAB
1 | Freq: Every morning | ORAL | 0 refills | Status: AC
Start: 2022-12-14 — End: ?
  Administered 2022-12-15 – 2022-12-16 (×2): 1 via ORAL

## 2022-12-14 MED ORDER — NICOTINE 21 MG/24 HR TD PT24
1 | Freq: Every day | TRANSDERMAL | 0 refills | Status: AC
Start: 2022-12-14 — End: ?

## 2022-12-14 MED ORDER — HEPARIN (PORCINE) INITIAL BOLUS FOR CONTINUOUS INF (BAG)
10000 [IU] | Freq: Once | INTRAVENOUS | 0 refills | Status: AC
Start: 2022-12-14 — End: ?

## 2022-12-14 MED ORDER — HEPARIN (PORCINE) IN 5 % DEX 20,000 UNIT/500 ML (40 UNIT/ML) IV SOLP
0-2000 [IU]/h | INTRAVENOUS | 0 refills | Status: AC
Start: 2022-12-14 — End: ?
  Administered 2022-12-15: 13:00:00 1100 [IU]/h via INTRAVENOUS
  Administered 2022-12-15: 05:00:00 1000 [IU]/h via INTRAVENOUS

## 2022-12-14 MED ORDER — HYDROCODONE-ACETAMINOPHEN 10-325 MG PO TAB
1 | Freq: Three times a day (TID) | ORAL | 0 refills | Status: DC
Start: 2022-12-14 — End: 2022-12-15

## 2022-12-14 MED ORDER — INSULIN ASPART 100 UNIT/ML SC FLEXPEN
0-6 [IU] | Freq: Before meals | SUBCUTANEOUS | 0 refills | Status: AC
Start: 2022-12-14 — End: ?
  Administered 2022-12-15: 14:00:00 1 [IU] via SUBCUTANEOUS

## 2022-12-14 MED ORDER — INSULIN GLARGINE 100 UNIT/ML (3 ML) SC INJ PEN
60 [IU] | Freq: Every morning | SUBCUTANEOUS | 0 refills | Status: AC
Start: 2022-12-14 — End: ?

## 2022-12-14 MED ORDER — DEXTROSE 50 % IN WATER (D50W) IV SYRG
12.5-25 g | INTRAVENOUS | 0 refills | Status: AC | PRN
Start: 2022-12-14 — End: ?

## 2022-12-14 MED ORDER — ONDANSETRON HCL (PF) 4 MG/2 ML IJ SOLN
4 mg | INTRAVENOUS | 0 refills | Status: AC | PRN
Start: 2022-12-14 — End: ?

## 2022-12-15 ENCOUNTER — Inpatient Hospital Stay: Admit: 2022-12-15 | Payer: MEDICARE

## 2022-12-15 ENCOUNTER — Inpatient Hospital Stay: Admit: 2022-12-15 | Discharge: 2022-12-15 | Payer: MEDICARE

## 2022-12-15 ENCOUNTER — Encounter: Admit: 2022-12-15 | Discharge: 2022-12-15 | Payer: MEDICARE

## 2022-12-15 MED ORDER — PERFLUTREN LIPID MICROSPHERES 1.1 MG/ML IV SUSP
1-10 mL | Freq: Once | INTRAVENOUS | 0 refills | Status: CP | PRN
Start: 2022-12-15 — End: ?
  Administered 2022-12-15: 20:00:00 2 mL via INTRAVENOUS

## 2022-12-15 MED ORDER — SODIUM CHLORIDE 0.9 % IJ SOLN
10 mL | Freq: Once | INTRAVENOUS | 0 refills | Status: CP
Start: 2022-12-15 — End: ?
  Administered 2022-12-15: 20:00:00 10 mL via INTRAVENOUS

## 2022-12-15 MED ADMIN — ASPIRIN 325 MG PO TAB [681]: 325 mg | ORAL | @ 14:00:00 | Stop: 2022-12-15 | NDC 66553000101

## 2022-12-15 MED ADMIN — SODIUM CHLORIDE 0.9 % IV SOLP [27838]: 1000 mL | INTRAVENOUS | @ 14:00:00 | Stop: 2022-12-17 | NDC 00338004904

## 2022-12-15 MED ADMIN — ACETYLCYSTEINE 600 MG PO CAP [136847]: 600 mg | ORAL | @ 20:00:00 | Stop: 2022-12-15 | NDC 27434000211

## 2022-12-16 ENCOUNTER — Encounter: Admit: 2022-12-16 | Discharge: 2022-12-16 | Payer: MEDICARE

## 2022-12-16 ENCOUNTER — Inpatient Hospital Stay: Admit: 2022-12-16 | Discharge: 2022-12-16 | Payer: MEDICARE

## 2022-12-16 MED ADMIN — INSULIN GLARGINE 100 UNIT/ML (3 ML) SC INJ PEN [163596]: 30 [IU] | SUBCUTANEOUS | @ 02:00:00 | NDC 00088221905

## 2022-12-16 MED ADMIN — INSULIN REGULAR HUMAN 100 UNIT/ML (3 ML) SC INPN [86625]: 14 [IU] | SUBCUTANEOUS | @ 15:00:00 | Stop: 2022-12-16 | NDC 00169300301

## 2022-12-16 MED ADMIN — METOPROLOL SUCCINATE 25 MG PO TB24 [81866]: 25 mg | ORAL | @ 02:00:00 | NDC 60687039011

## 2022-12-16 MED ADMIN — RANOLAZINE 500 MG PO TB12 [95539]: 500 mg | ORAL | @ 02:00:00 | NDC 70756070360

## 2022-12-16 MED ADMIN — RANOLAZINE 500 MG PO TB12 [95539]: 500 mg | ORAL | @ 14:00:00 | Stop: 2022-12-16 | NDC 70756070360

## 2022-12-16 MED ADMIN — ASPIRIN 81 MG PO CHEW [680]: 81 mg | ORAL | @ 14:00:00 | Stop: 2022-12-16 | NDC 00904679430

## 2022-12-16 MED FILL — METOPROLOL SUCCINATE 25 MG PO TB24: 25 mg | ORAL | 90 days supply | Qty: 90 | Fill #1 | Status: CP

## 2022-12-16 MED FILL — ICOSAPENT ETHYL 1 GRAM PO CAP: 1 gram | ORAL | 30 days supply | Qty: 120 | Fill #1 | Status: CP

## 2022-12-16 MED FILL — RANOLAZINE 500 MG PO TB12: 500 mg | ORAL | 30 days supply | Qty: 60 | Fill #1 | Status: CP

## 2022-12-16 MED FILL — JARDIANCE 10 MG PO TAB: 10 mg | ORAL | 30 days supply | Qty: 30 | Fill #1 | Status: CP

## 2022-12-16 NOTE — Progress Notes
Ambulatory (External) Cardiac Monitor Enrollment Record     Placement Location: Inpatient  Clinic Location: MPB5  Vendor: [facility] Cardiac Telemetry (MCOT/MCT)?: No  Duration of Monitor (in days): 14  Monitor Diagnosis: Chest Pain (R07.9)  Secondary Monitor Diagnosis: Other (I25.110 Atherosclerotic heart disease of native coronary artery with unstable angina pectoris)  Ordering Provider: Renato Shin, DO  AMB Monitor Serial Number: [id number] (INPT)  No data recorded    Start Time and Date: 12/16/22 11:01 AM   Patient Name: Jeremy Burnett.  DOB: [birth year]  MRN: [id number]  Sex: male  Mobile Phone Number: [phone] (mobile)  Home Phone Number: [phone]  Patient Address: [address] [place] [address]  Insurance Coverage: [employer] CHOICE PFFS  Insurance ID: [id number]  Insurance Group #: [id number]  Insurance Subscriber: CREWE, SKEETER.  Implanted Cardiac Device Information: No results found for: "EPDEVTYP"      Patient instructed to contact company phone number on the monitor box with questions regarding billing, placement, troubleshooting.     Dorena Dew    ____________________________________________________________    Clinic Staff:    Complete additional steps for documentation double check/Co-Sign.  In Follow-up, send chart upon closing encounter to [facility] HRM AMBULATORY MONITORS    HRM Ambulatory Monitoring Team:  Schedule on appropriate template and check-in.   Clinic Placement Schedule on clinic location [facility] schedule   Home Enrollment Schedule on Home Enrollment schedule ([facility])   Given to patient in clinic for self-placement Schedule on Home Enrollment schedule ([facility])   Inpatient Schedule on [facility] AMBULATORY MONITORING template   2. Please enroll with appropriate vendor.

## 2022-12-19 ENCOUNTER — Encounter: Admit: 2022-12-19 | Discharge: 2022-12-19 | Payer: MEDICARE

## 2023-01-05 ENCOUNTER — Encounter: Admit: 2023-01-05 | Discharge: 2023-01-05 | Payer: MEDICARE

## 2023-01-05 ENCOUNTER — Ambulatory Visit: Admit: 2023-01-05 | Discharge: 2023-01-06 | Payer: MEDICARE

## 2023-01-05 DIAGNOSIS — R9439 Abnormal result of other cardiovascular function study: Secondary | ICD-10-CM

## 2023-01-05 DIAGNOSIS — G971 Other reaction to spinal and lumbar puncture: Secondary | ICD-10-CM

## 2023-01-05 DIAGNOSIS — E039 Hypothyroidism, unspecified: Secondary | ICD-10-CM

## 2023-01-05 DIAGNOSIS — M2550 Pain in unspecified joint: Secondary | ICD-10-CM

## 2023-01-05 DIAGNOSIS — M1990 Unspecified osteoarthritis, unspecified site: Secondary | ICD-10-CM

## 2023-01-05 DIAGNOSIS — K589 Irritable bowel syndrome without diarrhea: Secondary | ICD-10-CM

## 2023-01-05 DIAGNOSIS — E785 Hyperlipidemia, unspecified: Secondary | ICD-10-CM

## 2023-01-05 DIAGNOSIS — G4730 Sleep apnea, unspecified: Secondary | ICD-10-CM

## 2023-01-05 DIAGNOSIS — I2510 Atherosclerotic heart disease of native coronary artery without angina pectoris: Secondary | ICD-10-CM

## 2023-01-05 DIAGNOSIS — N200 Calculus of kidney: Secondary | ICD-10-CM

## 2023-01-05 DIAGNOSIS — Z720 Tobacco use: Secondary | ICD-10-CM

## 2023-01-05 DIAGNOSIS — M810 Age-related osteoporosis without current pathological fracture: Secondary | ICD-10-CM

## 2023-01-05 DIAGNOSIS — I8390 Asymptomatic varicose veins of unspecified lower extremity: Secondary | ICD-10-CM

## 2023-01-05 DIAGNOSIS — I209 Angina pectoris, unspecified: Secondary | ICD-10-CM

## 2023-01-05 DIAGNOSIS — K219 Gastro-esophageal reflux disease without esophagitis: Secondary | ICD-10-CM

## 2023-01-05 DIAGNOSIS — R55 Syncope and collapse: Secondary | ICD-10-CM

## 2023-01-05 DIAGNOSIS — R079 Chest pain, unspecified: Secondary | ICD-10-CM

## 2023-01-05 DIAGNOSIS — E349 Endocrine disorder, unspecified: Secondary | ICD-10-CM

## 2023-01-05 DIAGNOSIS — C801 Malignant (primary) neoplasm, unspecified: Secondary | ICD-10-CM

## 2023-01-05 DIAGNOSIS — Z9229 Personal history of other drug therapy: Secondary | ICD-10-CM

## 2023-01-05 DIAGNOSIS — R000 Tachycardia, unspecified: Secondary | ICD-10-CM

## 2023-01-05 DIAGNOSIS — I219 Acute myocardial infarction, unspecified: Secondary | ICD-10-CM

## 2023-01-05 DIAGNOSIS — K5732 Diverticulitis of large intestine without perforation or abscess without bleeding: Secondary | ICD-10-CM

## 2023-01-05 DIAGNOSIS — R112 Nausea with vomiting, unspecified: Secondary | ICD-10-CM

## 2023-01-05 DIAGNOSIS — D499 Neoplasm of unspecified behavior of unspecified site: Secondary | ICD-10-CM

## 2023-01-05 DIAGNOSIS — E781 Pure hyperglyceridemia: Secondary | ICD-10-CM

## 2023-01-05 DIAGNOSIS — I10 Essential (primary) hypertension: Secondary | ICD-10-CM

## 2023-01-05 DIAGNOSIS — N189 Chronic kidney disease, unspecified: Secondary | ICD-10-CM

## 2023-01-05 DIAGNOSIS — R42 Dizziness and giddiness: Secondary | ICD-10-CM

## 2023-01-05 DIAGNOSIS — E119 Type 2 diabetes mellitus without complications: Secondary | ICD-10-CM

## 2023-01-05 NOTE — Patient Instructions
Change the mask every 3-6 months  Change the hose once a year  Change the filters once a month  Wash the mask, hose and humidifier every day with soap water or vinegar with water. Most insurances are requiring patients to meet 'CPAP compliance guidelines' which require 4 hours of use a night.  This will require you to use your CPAP at least 4 hours a night over a period of 30 days.  You have 90 days from the date you receive your machine to meet these guidelines.  If this is the case you will also be required to have a follow-up appt within this time frame as well. If you do not meet compliance, your insurance may discontinue payment for your CPAP.    Most insurance plans allow a 30 day window from date of setup to try different masks.    For questions or issues, call my nurse at [phone] If you need to schedule an appointment, you can call [phone]. A new CPAP or BiPAP machine has been ordered for you.  We will send the order to a medical supply company (aka DME or Durable Medical Equipment).  Once they received the order, they will run it through your insurance company for authorization.  They will contact you once this is completed, usually 7-10 days after the order is placed.  They will go over all financial information with you and schedule an appointment to get you set up with your equipment.  Please contact Sarah RN for any questions or concerns at [phone]

## 2023-01-06 ENCOUNTER — Encounter: Admit: 2023-01-06 | Discharge: 2023-01-06 | Payer: MEDICARE

## 2023-01-06 DIAGNOSIS — I8390 Asymptomatic varicose veins of unspecified lower extremity: Secondary | ICD-10-CM

## 2023-01-06 DIAGNOSIS — N200 Calculus of kidney: Secondary | ICD-10-CM

## 2023-01-06 DIAGNOSIS — E781 Pure hyperglyceridemia: Secondary | ICD-10-CM

## 2023-01-06 DIAGNOSIS — I10 Essential (primary) hypertension: Secondary | ICD-10-CM

## 2023-01-06 DIAGNOSIS — E039 Hypothyroidism, unspecified: Secondary | ICD-10-CM

## 2023-01-06 DIAGNOSIS — K5732 Diverticulitis of large intestine without perforation or abscess without bleeding: Secondary | ICD-10-CM

## 2023-01-06 DIAGNOSIS — I209 Angina pectoris, unspecified: Secondary | ICD-10-CM

## 2023-01-06 DIAGNOSIS — M810 Age-related osteoporosis without current pathological fracture: Secondary | ICD-10-CM

## 2023-01-06 DIAGNOSIS — R079 Chest pain, unspecified: Secondary | ICD-10-CM

## 2023-01-06 DIAGNOSIS — E349 Endocrine disorder, unspecified: Secondary | ICD-10-CM

## 2023-01-06 DIAGNOSIS — E119 Type 2 diabetes mellitus without complications: Secondary | ICD-10-CM

## 2023-01-06 DIAGNOSIS — Z9229 Personal history of other drug therapy: Secondary | ICD-10-CM

## 2023-01-06 DIAGNOSIS — R000 Tachycardia, unspecified: Secondary | ICD-10-CM

## 2023-01-06 DIAGNOSIS — I2510 Atherosclerotic heart disease of native coronary artery without angina pectoris: Secondary | ICD-10-CM

## 2023-01-06 DIAGNOSIS — K589 Irritable bowel syndrome without diarrhea: Secondary | ICD-10-CM

## 2023-01-06 DIAGNOSIS — R42 Dizziness and giddiness: Secondary | ICD-10-CM

## 2023-01-06 DIAGNOSIS — D499 Neoplasm of unspecified behavior of unspecified site: Secondary | ICD-10-CM

## 2023-01-06 DIAGNOSIS — G4730 Sleep apnea, unspecified: Secondary | ICD-10-CM

## 2023-01-06 DIAGNOSIS — K219 Gastro-esophageal reflux disease without esophagitis: Secondary | ICD-10-CM

## 2023-01-06 DIAGNOSIS — I219 Acute myocardial infarction, unspecified: Secondary | ICD-10-CM

## 2023-01-06 DIAGNOSIS — R112 Nausea with vomiting, unspecified: Secondary | ICD-10-CM

## 2023-01-06 DIAGNOSIS — Z720 Tobacco use: Secondary | ICD-10-CM

## 2023-01-06 DIAGNOSIS — M2550 Pain in unspecified joint: Secondary | ICD-10-CM

## 2023-01-06 DIAGNOSIS — C801 Malignant (primary) neoplasm, unspecified: Secondary | ICD-10-CM

## 2023-01-06 DIAGNOSIS — E785 Hyperlipidemia, unspecified: Secondary | ICD-10-CM

## 2023-01-06 DIAGNOSIS — G971 Other reaction to spinal and lumbar puncture: Secondary | ICD-10-CM

## 2023-01-06 DIAGNOSIS — N189 Chronic kidney disease, unspecified: Secondary | ICD-10-CM

## 2023-01-06 DIAGNOSIS — R55 Syncope and collapse: Secondary | ICD-10-CM

## 2023-01-06 DIAGNOSIS — M1990 Unspecified osteoarthritis, unspecified site: Secondary | ICD-10-CM

## 2023-01-06 DIAGNOSIS — R9439 Abnormal result of other cardiovascular function study: Secondary | ICD-10-CM

## 2023-01-06 NOTE — Telephone Encounter
Pt set up with cPAP @ 5-15cm H2O on 01/05/23.  DME: [facility]  Insurance: [employer] medicare   If the patient has to meet compliance, their compliance window will be from 02/05/23 to 04/05/23.     Confirmed pt registered online in [facility]    Pt scheduled for follow up 03/31/23. with Christie Beckers.    Order Completed

## 2023-01-09 ENCOUNTER — Encounter: Admit: 2023-01-09 | Discharge: 2023-01-09 | Payer: MEDICARE

## 2023-01-09 DIAGNOSIS — Z720 Tobacco use: Secondary | ICD-10-CM

## 2023-01-09 NOTE — Telephone Encounter
Duthuluru, Madelaine Etienne, MD  Brynda Greathouse, RN  Deatra Mcmahen,  Please order low dose CT chest for lung cancer screening at [facility], [place].TY      Order entered.      [facility]: [phone] Radiology service information  [phone] Radiology scheduling information

## 2023-01-29 ENCOUNTER — Encounter: Admit: 2023-01-29 | Discharge: 2023-01-29 | Payer: MEDICARE

## 2023-01-31 ENCOUNTER — Encounter: Admit: 2023-01-31 | Discharge: 2023-01-31 | Payer: MEDICARE

## 2023-02-03 ENCOUNTER — Encounter: Admit: 2023-02-03 | Discharge: 2023-02-03 | Payer: MEDICARE

## 2023-02-03 MED ORDER — ISOSORBIDE MONONITRATE 60 MG PO TB24
60 mg | ORAL_TABLET | Freq: Every morning | ORAL | 3 refills | 90.00000 days | Status: AC
Start: 2023-02-03 — End: ?

## 2023-02-14 ENCOUNTER — Encounter: Admit: 2023-02-14 | Discharge: 2023-02-14 | Payer: MEDICARE

## 2023-02-14 NOTE — Telephone Encounter
An encounter has been created for documentation only (often for preparation of an upcoming appointment or for follow up on orders/imaging) and patient does not need contact RN and did not miss a phone call or appointment.     RN prepped patient information for clinic with Christie Beckers APRN NP on 03/31/23 at 1600 via in person appt.     Here for compliance follow up compliance window will be from 02/05/23 to 04/05/23.   Dx:OSA  Pt known to sleep department by Dr. Janeece Fitting    Hx of hypertension, hyperlipidemia, diabetes mellitus, hypothyroidism, coronary disease and morbid obesity     He smokes 1ppd and has smoked since 71 yrs of age    Pt also followed by cardiology.    Last SS: 10/07/22 with AHI 23.9  Pt set up with cPAP @ 5-15cm H2O on 01/05/23.      LV: 01/05/23 with Dr Janeece Fitting   Order for  low dose CT chest for lung cancer screening at [facility] [place].    Primary insomnia   Meds:ambien    ZOX:[id number]  RN obtained download from AV

## 2023-03-08 ENCOUNTER — Encounter: Admit: 2023-03-08 | Discharge: 2023-03-08 | Payer: MEDICARE

## 2023-03-08 NOTE — Telephone Encounter
-----   Message from Malinta B sent at 03/08/2023 10:09 AM CDT -----  Could we please order the CT lung screening from 02/08/23 report [facility] [place].  Pt will be seen on 03/27/23.    Thank you!  Laurie Panda RN

## 2023-03-08 NOTE — Telephone Encounter
Imaging and report requested.

## 2023-03-24 NOTE — Progress Notes
Date of Service: 03/27/2023    Subjective:             Jeremy Burnett. is a 57 y.o. male.    History of Present Illness  I had the pleasure of seeing [name] in clinic for OSA follow-up. This is a CPAP compliance visit. He is known to Dr. Evonnie Pat. He has moderate OSA (AHI 23.9) and is on CPAP 5-15.  He is working on wearing it but struggles with moving from side to side. He is wearing a hybrid full face mask.  He had his sleep study done at the request of his cardiologist  Pressure is comfortable unless it starts leaking  Denies shortness of breath, gasping for air, chest pain at night  No longer snoring at night, actually very quiet     He smokes a pack per day   He has had a PFT done in [place] a few years ago   Had LDCT lung cancer screening 02/08/2023- 1mm along the right major fissure, recommend repeat a CT in 1 years     Hard to get around because of arthritis         03/27/2023    12:20 PM 01/02/2023     7:29 PM   Epworth Sleepiness Scale   Sitting and reading 0 1   Watching TV 0 1   Sitting inactive in a public place (e.g. a theater or a meeting) 0 1   As a passenger in a car for an hour without a break 0 1   Lying down to rest in the afternoon when circumstances permit 3 3   Sitting and talking to someone 0 0   Sitting quietly after a lunch without alcohol 0 0   In a car, while stopped for a few minutes in traffic 0 0   Epworth Sleepiness Scale Score 3 7                   Objective:         ammonium lactate(+) (LAC-HYDRIN) 12 % topical cream Apply  topically to affected area twice daily.    aspirin EC 81 mg tablet Take one tablet by mouth every morning.    atorvastatin (LIPITOR) 40 mg tablet Take one tablet by mouth daily.    CINNAMON BARK PO Take 385 mg by mouth daily.    CoQ10 (Ubiquinol) 200 mg cap Take two capsules by mouth daily.    cyclobenzaprine (FLEXERIL) 10 mg tablet Take one tablet by mouth as Needed.    diclofenac sodium (VOLTAREN) 1 % topical gel Apply  topically to affected area as Needed. duloxetine DR (CYMBALTA) 30 mg capsule Take one capsule by mouth daily.    empagliflozin (JARDIANCE) 10 mg tablet Take one tablet by mouth daily. Indications: chronic heart failure    fish oil- omega 3-DHA/EPA 300/1,000 mg capsule Take one capsule by mouth twice daily.    Garlic 1,000 mg cap Take one capsule by mouth daily.    HYDROcodone/acetaminophen (NORCO) 10/325 mg tablet Take one tablet by mouth three times daily.    isosorbide mononitrate ER (IMDUR) 60 mg ER tablet Take one tablet by mouth every morning.    LANTUS U-100 INSULIN 100 unit/mL subcutaneous solution Inject sixty Units under the skin at bedtime daily.    levothyroxine (SYNTHROID) 25 mcg tablet Take one tablet by mouth daily 30 minutes before breakfast.    lidocaine (LIDODERM) 5 % topical patch Apply one patch topically to affected area as  Needed.    lisinopril (PRINIVIL; ZESTRIL) 10 mg tablet Take one tablet by mouth daily.    Magnesium 250 mg tab Take one tablet by mouth daily.    metFORMIN (GLUCOPHAGE) 1,000 mg tablet Take one tablet by mouth three times daily. resume on 11/29/20 with evening dose    metoprolol succinate XL (TOPROL XL) 25 mg extended release tablet Take one tablet by mouth at bedtime daily. Indications: chronic heart failure    nitroglycerin (NITRO-DUR) 0.2 mg/hr patch Apply one patch to top of skin as directed daily. 12 hours on, then 12 hours off.    nitroglycerin (NITROSTAT) 0.4 mg tablet Place 1 Tab under tongue every 5 minutes as needed for Chest Pain. Max of 3 tablets, call 911.    NOVOLIN R REGULAR U100 INSULIN 100 unit/mL injection solution Inject thirty Units under the skin twice daily.    Potassium Gluconate 595 mg (99 mg) tab Take 99 mg by mouth daily.    triamterene/hydrochlorothiazide (MAXZIDE) 37.5/25 mg tablet Take one tablet by mouth every morning.    turmeric root extract 500 mg tab Take 1 tablet by mouth daily.    vitamins, B complex tablet Take one tablet by mouth daily.    zolpidem (AMBIEN) 10 mg tablet Take one tablet by mouth at bedtime as needed for Sleep.     Vitals:    03/27/23 1218   BP: (!) 86/58   BP Source: Arm, Left Upper   Pulse: 108   Resp: 20   SpO2: 98%   PainSc: Ten   Weight: (!) 143.9 kg (317 lb 3.2 oz)   Height: 175.3 cm (5' 9)     Body mass index is 46.84 kg/m?Marland Kitchen     Physical Exam  Vitals reviewed.   Constitutional:       Appearance: Normal appearance.   Pulmonary:      Effort: Pulmonary effort is normal. No respiratory distress.      Breath sounds: Normal breath sounds. No wheezing.   Skin:     General: Skin is warm and dry.   Neurological:      Mental Status: He is alert and oriented to person, place, and time.   Psychiatric:         Mood and Affect: Mood normal.         Behavior: Behavior normal.         Thought Content: Thought content normal.         Judgment: Judgment normal.                      Assessment and Plan:    Problem   Obstructive Sleep Apnea Syndrome    HST 10/07/2022  BMI 47 318lbs  AHI of 23.9 supine 24.6  O2 low 69.9%, time < 88% 21 minutes.    Pt set up with cPAP @ 5-15cm H2O on 01/05/23.  DME: [facility]  Insurance: [employer] medicare   If the patient has to meet compliance, their compliance window will be from 02/05/23 to 04/05/23.      Tobacco Abuse    - Reviewed LDCT report, cannot see images. Report shows small 1mm right sided nodule. Recommend repeat scan in a year  - Encouraged smoking cessation           Obstructive sleep apnea syndrome  Reviewed CPAP download. 27/30 days of usage.   Wearing 4 hours or greater 57% of nights. The average use on days used was 5 hours and  15 minutes.  Residual AHI 2.3.    The 95th percentile pressure was 14.5 cmH20, with a maximum of 14.8 cmH20, and a median of 12.5 cmH20.  The median leak was 23.4 L/min. He is tolerating and benefiting from therapy.     - Will continue current settings, CPAP 5-15. DME order placed for mask refit and noc ox. Encouraged him to continue wearing CPAP nightly.     - Keep in contact with DME for routine supplies, need to contact us directly if unable to communicate with DME in a timely manner.     He has our contact information and was encouraged to call us with any questions or concerns.    RTC in 6 months    Orders Placed This Encounter    AMB REFERRAL TO HOME CARE     Total time- 28 minutes

## 2023-03-27 ENCOUNTER — Ambulatory Visit: Admit: 2023-03-27 | Discharge: 2023-03-28 | Payer: MEDICARE

## 2023-03-27 ENCOUNTER — Encounter: Admit: 2023-03-27 | Discharge: 2023-03-27 | Payer: MEDICARE

## 2023-03-27 DIAGNOSIS — N200 Calculus of kidney: Secondary | ICD-10-CM

## 2023-03-27 DIAGNOSIS — D499 Neoplasm of unspecified behavior of unspecified site: Secondary | ICD-10-CM

## 2023-03-27 DIAGNOSIS — G971 Other reaction to spinal and lumbar puncture: Secondary | ICD-10-CM

## 2023-03-27 DIAGNOSIS — Z720 Tobacco use: Secondary | ICD-10-CM

## 2023-03-27 DIAGNOSIS — R55 Syncope and collapse: Secondary | ICD-10-CM

## 2023-03-27 DIAGNOSIS — I10 Essential (primary) hypertension: Secondary | ICD-10-CM

## 2023-03-27 DIAGNOSIS — K5732 Diverticulitis of large intestine without perforation or abscess without bleeding: Secondary | ICD-10-CM

## 2023-03-27 DIAGNOSIS — Z9229 Personal history of other drug therapy: Secondary | ICD-10-CM

## 2023-03-27 DIAGNOSIS — G4733 Obstructive sleep apnea (adult) (pediatric): Secondary | ICD-10-CM

## 2023-03-27 DIAGNOSIS — K222 Esophageal obstruction: Secondary | ICD-10-CM

## 2023-03-27 DIAGNOSIS — E039 Hypothyroidism, unspecified: Secondary | ICD-10-CM

## 2023-03-27 DIAGNOSIS — I219 Acute myocardial infarction, unspecified: Secondary | ICD-10-CM

## 2023-03-27 DIAGNOSIS — E119 Type 2 diabetes mellitus without complications: Secondary | ICD-10-CM

## 2023-03-27 DIAGNOSIS — R112 Nausea with vomiting, unspecified: Secondary | ICD-10-CM

## 2023-03-27 DIAGNOSIS — M810 Age-related osteoporosis without current pathological fracture: Secondary | ICD-10-CM

## 2023-03-27 DIAGNOSIS — R9439 Abnormal result of other cardiovascular function study: Secondary | ICD-10-CM

## 2023-03-27 DIAGNOSIS — C801 Malignant (primary) neoplasm, unspecified: Secondary | ICD-10-CM

## 2023-03-27 DIAGNOSIS — R079 Chest pain, unspecified: Secondary | ICD-10-CM

## 2023-03-27 DIAGNOSIS — E781 Pure hyperglyceridemia: Secondary | ICD-10-CM

## 2023-03-27 DIAGNOSIS — N189 Chronic kidney disease, unspecified: Secondary | ICD-10-CM

## 2023-03-27 DIAGNOSIS — I2510 Atherosclerotic heart disease of native coronary artery without angina pectoris: Secondary | ICD-10-CM

## 2023-03-27 DIAGNOSIS — R42 Dizziness and giddiness: Secondary | ICD-10-CM

## 2023-03-27 DIAGNOSIS — K219 Gastro-esophageal reflux disease without esophagitis: Secondary | ICD-10-CM

## 2023-03-27 DIAGNOSIS — I8390 Asymptomatic varicose veins of unspecified lower extremity: Secondary | ICD-10-CM

## 2023-03-27 DIAGNOSIS — G4730 Sleep apnea, unspecified: Secondary | ICD-10-CM

## 2023-03-27 DIAGNOSIS — M2550 Pain in unspecified joint: Secondary | ICD-10-CM

## 2023-03-27 DIAGNOSIS — E859 Amyloidosis, unspecified: Secondary | ICD-10-CM

## 2023-03-27 DIAGNOSIS — K589 Irritable bowel syndrome without diarrhea: Secondary | ICD-10-CM

## 2023-03-27 DIAGNOSIS — I209 Angina pectoris, unspecified: Secondary | ICD-10-CM

## 2023-03-27 DIAGNOSIS — E349 Endocrine disorder, unspecified: Secondary | ICD-10-CM

## 2023-03-27 DIAGNOSIS — R000 Tachycardia, unspecified: Secondary | ICD-10-CM

## 2023-03-27 DIAGNOSIS — E785 Hyperlipidemia, unspecified: Secondary | ICD-10-CM

## 2023-03-27 DIAGNOSIS — M1990 Unspecified osteoarthritis, unspecified site: Secondary | ICD-10-CM

## 2023-03-27 NOTE — Telephone Encounter
The patient's PAP compliance f/u office visit notes from Christie Beckers on 03/27/23 were faxed to their DME, [facility] on 03/27/23 at 1315.  Jori Moll  .

## 2023-03-27 NOTE — Patient Instructions
If you have questions or concerns, please call my nurse at [phone] or feel free to send me a mychart message

## 2023-04-06 ENCOUNTER — Inpatient Hospital Stay: Admit: 2023-04-06 | Discharge: 2023-04-06 | Payer: MEDICARE

## 2023-04-06 ENCOUNTER — Encounter: Admit: 2023-04-06 | Discharge: 2023-04-06 | Payer: MEDICARE

## 2023-04-06 MED ADMIN — AMPICILLIN SODIUM 2 GRAM IJ SOLR [472]: 2 g | INTRAVENOUS | @ 18:00:00 | Stop: 2023-04-06 | NDC 55150011420

## 2023-04-06 MED ADMIN — SODIUM CHLORIDE 0.9 % IV SOLP [27838]: 500 mg | INTRAVENOUS | @ 09:00:00 | Stop: 2023-04-20 | NDC 00338004902

## 2023-04-06 MED ADMIN — DULOXETINE 30 MG PO CPDR [93424]: 30 mg | ORAL | @ 13:00:00 | NDC 00904704461

## 2023-04-06 MED ADMIN — AMPICILLIN SODIUM 2 GRAM IJ SOLR [472]: 2 g | INTRAVENOUS | @ 05:00:00 | Stop: 2023-04-06 | NDC 55150011420

## 2023-04-06 MED ADMIN — SODIUM CHLORIDE 0.9 % IV PGBK (MB+) [95161]: 2 g | INTRAVENOUS | @ 05:00:00 | Stop: 2023-04-06 | NDC 00338915930

## 2023-04-06 MED ADMIN — SODIUM CHLORIDE 0.9 % IV PGBK (MB+) [95161]: 2 g | INTRAVENOUS | @ 10:00:00 | Stop: 2023-04-06 | NDC 00338915930

## 2023-04-06 MED ADMIN — AMPICILLIN SODIUM 2 GRAM IJ SOLR [472]: 2 g | INTRAVENOUS | @ 21:00:00 | Stop: 2023-04-06 | NDC 55150011420

## 2023-04-06 MED ADMIN — DEXAMETHASONE SODIUM PHOSPHATE 10 MG/ML IJ SOLN [2331]: 10 mg | INTRAVENOUS | @ 18:00:00 | Stop: 2023-04-06 | NDC 00641036721

## 2023-04-06 MED ADMIN — CEFTRIAXONE INJ 2GM IVP [210254]: 2 g | INTRAVENOUS | @ 19:00:00 | Stop: 2023-04-06 | NDC 60505614900

## 2023-04-06 MED ADMIN — ATORVASTATIN 40 MG PO TAB [77113]: 40 mg | ORAL | @ 13:00:00 | NDC 00904629261

## 2023-04-06 MED ADMIN — INSULIN ASPART 100 UNIT/ML SC FLEXPEN [87504]: 6 [IU] | SUBCUTANEOUS | @ 13:00:00 | NDC 00169633910

## 2023-04-06 MED ADMIN — HEPARIN, PORCINE (PF) 5,000 UNIT/0.5 ML IJ SYRG [95535]: 5000 [IU] | SUBCUTANEOUS | @ 19:00:00 | NDC 63323054303

## 2023-04-06 MED ADMIN — DEXAMETHASONE SODIUM PHOSPHATE 10 MG/ML IJ SOLN [2331]: 10 mg | INTRAVENOUS | @ 11:00:00 | Stop: 2023-04-06 | NDC 00641036721

## 2023-04-06 MED ADMIN — FUROSEMIDE 10 MG/ML IJ SOLN [3291]: 40 mg | INTRAVENOUS | @ 19:00:00 | Stop: 2023-04-06 | NDC 71288020304

## 2023-04-06 MED ADMIN — SODIUM CHLORIDE 0.9 % IV PGBK (MB+) [95161]: 2 g | INTRAVENOUS | @ 21:00:00 | Stop: 2023-04-06 | NDC 00338915930

## 2023-04-06 MED ADMIN — CEFEPIME 2 GRAM IJ SOLR [78195]: 2 g | INTRAVENOUS | @ 11:00:00 | Stop: 2023-04-06 | NDC 60505614700

## 2023-04-06 MED ADMIN — VANCOMYCIN 1.25 GRAM IV SOLR [338455]: 1250 mg | INTRAVENOUS | @ 08:00:00 | Stop: 2023-04-06 | NDC 67457082312

## 2023-04-06 MED ADMIN — AMPICILLIN SODIUM 2 GRAM IJ SOLR [472]: 2 g | INTRAVENOUS | @ 10:00:00 | Stop: 2023-04-06 | NDC 55150011420

## 2023-04-06 MED ADMIN — AMPICILLIN SODIUM 2 GRAM IJ SOLR [472]: 2 g | INTRAVENOUS | @ 13:00:00 | Stop: 2023-04-06 | NDC 55150011420

## 2023-04-06 MED ADMIN — SODIUM CHLORIDE 0.9 % IV PGBK (MB+) [95161]: 2 g | INTRAVENOUS | @ 11:00:00 | Stop: 2023-04-06 | NDC 00338915930

## 2023-04-06 MED ADMIN — SODIUM CHLORIDE 0.9 % IV PGBK (MB+) [95161]: 2 g | INTRAVENOUS | @ 18:00:00 | Stop: 2023-04-06 | NDC 00338915930

## 2023-04-06 MED ADMIN — LEVOTHYROXINE 25 MCG PO TAB [4420]: 25 ug | ORAL | @ 11:00:00 | NDC 00904694961

## 2023-04-06 MED ADMIN — SODIUM CHLORIDE 0.9 % IV SOLP [27838]: 1250 mg | INTRAVENOUS | @ 08:00:00 | Stop: 2023-04-06 | NDC 00338004902

## 2023-04-06 MED ADMIN — INSULIN GLARGINE 100 UNIT/ML (3 ML) SC INJ PEN [163596]: 50 [IU] | SUBCUTANEOUS | @ 13:00:00 | NDC 00088221905

## 2023-04-06 MED ADMIN — SODIUM CHLORIDE 0.9 % IV PGBK (MB+) [95161]: 2 g | INTRAVENOUS | @ 13:00:00 | Stop: 2023-04-06 | NDC 00338915930

## 2023-04-06 MED ADMIN — AZITHROMYCIN 500 MG IV SOLR [21063]: 500 mg | INTRAVENOUS | @ 09:00:00 | Stop: 2023-04-20 | NDC 55150017410

## 2023-04-06 MED ADMIN — SODIUM CHLORIDE 0.9 % IV SOLP [27838]: 500 mL | INTRAVENOUS | @ 21:00:00 | Stop: 2023-04-06 | NDC 00338004904

## 2023-04-07 ENCOUNTER — Inpatient Hospital Stay: Admit: 2023-04-07 | Discharge: 2023-04-07 | Payer: MEDICARE

## 2023-04-07 ENCOUNTER — Encounter: Admit: 2023-04-07 | Discharge: 2023-04-07 | Payer: MEDICARE

## 2023-04-07 DIAGNOSIS — I10 Essential (primary) hypertension: Secondary | ICD-10-CM

## 2023-04-07 DIAGNOSIS — I209 Angina pectoris, unspecified: Secondary | ICD-10-CM

## 2023-04-07 DIAGNOSIS — I2510 Atherosclerotic heart disease of native coronary artery without angina pectoris: Secondary | ICD-10-CM

## 2023-04-07 DIAGNOSIS — E859 Amyloidosis, unspecified: Secondary | ICD-10-CM

## 2023-04-07 DIAGNOSIS — E785 Hyperlipidemia, unspecified: Secondary | ICD-10-CM

## 2023-04-07 DIAGNOSIS — R42 Dizziness and giddiness: Secondary | ICD-10-CM

## 2023-04-07 DIAGNOSIS — E119 Type 2 diabetes mellitus without complications: Secondary | ICD-10-CM

## 2023-04-07 DIAGNOSIS — R55 Syncope and collapse: Secondary | ICD-10-CM

## 2023-04-07 DIAGNOSIS — K222 Esophageal obstruction: Secondary | ICD-10-CM

## 2023-04-07 DIAGNOSIS — N200 Calculus of kidney: Secondary | ICD-10-CM

## 2023-04-07 DIAGNOSIS — M1990 Unspecified osteoarthritis, unspecified site: Secondary | ICD-10-CM

## 2023-04-07 DIAGNOSIS — E781 Pure hyperglyceridemia: Secondary | ICD-10-CM

## 2023-04-07 DIAGNOSIS — I8390 Asymptomatic varicose veins of unspecified lower extremity: Secondary | ICD-10-CM

## 2023-04-07 DIAGNOSIS — R112 Nausea with vomiting, unspecified: Secondary | ICD-10-CM

## 2023-04-07 DIAGNOSIS — R079 Chest pain, unspecified: Secondary | ICD-10-CM

## 2023-04-07 DIAGNOSIS — R9439 Abnormal result of other cardiovascular function study: Secondary | ICD-10-CM

## 2023-04-07 DIAGNOSIS — R000 Tachycardia, unspecified: Secondary | ICD-10-CM

## 2023-04-07 DIAGNOSIS — M810 Age-related osteoporosis without current pathological fracture: Secondary | ICD-10-CM

## 2023-04-07 DIAGNOSIS — M2550 Pain in unspecified joint: Secondary | ICD-10-CM

## 2023-04-07 DIAGNOSIS — Z9229 Personal history of other drug therapy: Secondary | ICD-10-CM

## 2023-04-07 DIAGNOSIS — Z720 Tobacco use: Secondary | ICD-10-CM

## 2023-04-07 DIAGNOSIS — E349 Endocrine disorder, unspecified: Secondary | ICD-10-CM

## 2023-04-07 DIAGNOSIS — K219 Gastro-esophageal reflux disease without esophagitis: Secondary | ICD-10-CM

## 2023-04-07 DIAGNOSIS — D499 Neoplasm of unspecified behavior of unspecified site: Secondary | ICD-10-CM

## 2023-04-07 DIAGNOSIS — E039 Hypothyroidism, unspecified: Secondary | ICD-10-CM

## 2023-04-07 DIAGNOSIS — K5732 Diverticulitis of large intestine without perforation or abscess without bleeding: Secondary | ICD-10-CM

## 2023-04-07 DIAGNOSIS — G971 Other reaction to spinal and lumbar puncture: Secondary | ICD-10-CM

## 2023-04-07 DIAGNOSIS — I219 Acute myocardial infarction, unspecified: Secondary | ICD-10-CM

## 2023-04-07 DIAGNOSIS — N189 Chronic kidney disease, unspecified: Secondary | ICD-10-CM

## 2023-04-07 DIAGNOSIS — K589 Irritable bowel syndrome without diarrhea: Secondary | ICD-10-CM

## 2023-04-07 DIAGNOSIS — C801 Malignant (primary) neoplasm, unspecified: Secondary | ICD-10-CM

## 2023-04-07 DIAGNOSIS — G4730 Sleep apnea, unspecified: Secondary | ICD-10-CM

## 2023-04-07 MED ADMIN — AZITHROMYCIN 500 MG IV SOLR [21063]: 500 mg | INTRAVENOUS | @ 09:00:00 | Stop: 2023-04-20 | NDC 55150017410

## 2023-04-07 MED ADMIN — CEFTRIAXONE INJ 2GM IVP [210254]: 2 g | INTRAVENOUS | @ 19:00:00 | NDC 60505614900

## 2023-04-07 MED ADMIN — ASPIRIN 81 MG PO TBEC [14113]: 81 mg | ORAL | @ 14:00:00 | NDC 00904675180

## 2023-04-07 MED ADMIN — MAGNESIUM OXIDE 400 MG (241.3 MG MAGNESIUM) PO TAB [10491]: 400 mg | ORAL | @ 19:00:00 | Stop: 2023-04-08 | NDC 10006070028

## 2023-04-07 MED ADMIN — LACTULOSE 10 GRAM/15 ML PO LIQUID GROUP [280001]: 20 g | ORAL | @ 20:00:00 | NDC 00121115430

## 2023-04-07 MED ADMIN — ATORVASTATIN 40 MG PO TAB [77113]: 40 mg | ORAL | @ 14:00:00 | NDC 00904629261

## 2023-04-07 MED ADMIN — DULOXETINE 30 MG PO CPDR [93424]: 30 mg | ORAL | @ 14:00:00 | NDC 00904704461

## 2023-04-07 MED ADMIN — LACTULOSE 10 GRAM/15 ML PO LIQUID GROUP [280001]: 20 g | ORAL | @ 23:00:00 | NDC 00121115430

## 2023-04-07 MED ADMIN — HEPARIN, PORCINE (PF) 5,000 UNIT/0.5 ML IJ SYRG [95535]: 5000 [IU] | SUBCUTANEOUS | @ 19:00:00 | NDC 63323054303

## 2023-04-07 MED ADMIN — SODIUM CHLORIDE 0.9 % IV SOLP [27838]: 500 mg | INTRAVENOUS | @ 09:00:00 | Stop: 2023-04-20 | NDC 00338004902

## 2023-04-07 MED ADMIN — LEVOTHYROXINE 25 MCG PO TAB [4420]: 25 ug | ORAL | @ 10:00:00 | NDC 00904694961

## 2023-04-07 MED ADMIN — METOPROLOL SUCCINATE 25 MG PO TB24 [81866]: 25 mg | ORAL | @ 01:00:00 | NDC 00904632261

## 2023-04-07 MED ADMIN — HEPARIN, PORCINE (PF) 5,000 UNIT/0.5 ML IJ SYRG [95535]: 5000 [IU] | SUBCUTANEOUS | @ 03:00:00 | NDC 63323054303

## 2023-04-07 MED ADMIN — MELATONIN 5 MG PO TAB [168576]: 5 mg | ORAL | @ 01:00:00 | NDC 20555003901

## 2023-04-07 MED ADMIN — HEPARIN, PORCINE (PF) 5,000 UNIT/0.5 ML IJ SYRG [95535]: 5000 [IU] | SUBCUTANEOUS | @ 10:00:00 | NDC 63323054303

## 2023-04-08 ENCOUNTER — Inpatient Hospital Stay: Admit: 2023-04-08 | Discharge: 2023-04-08 | Payer: MEDICARE

## 2023-04-08 MED ADMIN — CEFTRIAXONE INJ 2GM IVP [210254]: 2 g | INTRAVENOUS | @ 19:00:00 | NDC 60505614900

## 2023-04-08 MED ADMIN — SODIUM CHLORIDE 0.9 % IV SOLP [27838]: 500 mg | INTRAVENOUS | @ 08:00:00 | Stop: 2023-04-20 | NDC 00338004902

## 2023-04-08 MED ADMIN — LEVOTHYROXINE 25 MCG PO TAB [4420]: 25 ug | ORAL | @ 11:00:00 | NDC 00904694961

## 2023-04-08 MED ADMIN — LACTULOSE 10 GRAM/15 ML PO LIQUID GROUP [280001]: 20 g | ORAL | @ 19:00:00 | NDC 00121115430

## 2023-04-08 MED ADMIN — ASPIRIN 81 MG PO TBEC [14113]: 81 mg | ORAL | @ 14:00:00 | NDC 00904675180

## 2023-04-08 MED ADMIN — LACTULOSE 10 GRAM/15 ML PO LIQUID GROUP [280001]: 20 g | ORAL | @ 14:00:00 | NDC 00121115430

## 2023-04-08 MED ADMIN — HEPARIN, PORCINE (PF) 5,000 UNIT/0.5 ML IJ SYRG [95535]: 5000 [IU] | SUBCUTANEOUS | @ 19:00:00 | NDC 63323054303

## 2023-04-08 MED ADMIN — HEPARIN, PORCINE (PF) 5,000 UNIT/0.5 ML IJ SYRG [95535]: 5000 [IU] | SUBCUTANEOUS | @ 02:00:00 | NDC 63323054303

## 2023-04-08 MED ADMIN — DULOXETINE 30 MG PO CPDR [93424]: 30 mg | ORAL | @ 14:00:00 | NDC 00904704461

## 2023-04-08 MED ADMIN — LACTULOSE 10 GRAM/15 ML PO LIQUID GROUP [280001]: 20 g | ORAL | @ 23:00:00 | NDC 00121115430

## 2023-04-08 MED ADMIN — MAGNESIUM OXIDE 400 MG (241.3 MG MAGNESIUM) PO TAB [10491]: 400 mg | ORAL | @ 01:00:00 | Stop: 2023-04-08 | NDC 10006070028

## 2023-04-08 MED ADMIN — LACTULOSE 10 GRAM/15 ML PO LIQUID GROUP [280001]: 20 g | ORAL | @ 01:00:00 | NDC 00121115430

## 2023-04-08 MED ADMIN — DICLOFENAC SODIUM 1 % TP GEL [168032]: 2 g | TOPICAL | @ 14:00:00 | NDC 00067815203

## 2023-04-08 MED ADMIN — METOPROLOL SUCCINATE 25 MG PO TB24 [81866]: 25 mg | ORAL | @ 01:00:00 | NDC 00904632261

## 2023-04-08 MED ADMIN — HEPARIN, PORCINE (PF) 5,000 UNIT/0.5 ML IJ SYRG [95535]: 5000 [IU] | SUBCUTANEOUS | @ 11:00:00 | NDC 63323054303

## 2023-04-08 MED ADMIN — AZITHROMYCIN 500 MG IV SOLR [21063]: 500 mg | INTRAVENOUS | @ 08:00:00 | Stop: 2023-04-20 | NDC 55150017410

## 2023-04-09 MED ADMIN — DULOXETINE 30 MG PO CPDR [93424]: 30 mg | ORAL | @ 13:00:00 | NDC 00904704461

## 2023-04-09 MED ADMIN — SODIUM CHLORIDE 0.9 % IV SOLP [27838]: 500 mg | INTRAVENOUS | @ 08:00:00 | Stop: 2023-04-20 | NDC 00338004902

## 2023-04-09 MED ADMIN — HEPARIN, PORCINE (PF) 5,000 UNIT/0.5 ML IJ SYRG [95535]: 5000 [IU] | SUBCUTANEOUS | @ 01:00:00 | NDC 63323054303

## 2023-04-09 MED ADMIN — LACTULOSE 10 GRAM/15 ML PO LIQUID GROUP [280001]: 20 g | ORAL | @ 01:00:00 | NDC 00121115430

## 2023-04-09 MED ADMIN — FUROSEMIDE 40 MG PO TAB [3295]: 40 mg | ORAL | @ 01:00:00 | NDC 00904717861

## 2023-04-09 MED ADMIN — FUROSEMIDE 40 MG PO TAB [3295]: 40 mg | ORAL | @ 13:00:00 | Stop: 2023-04-10 | NDC 00904717861

## 2023-04-09 MED ADMIN — ASPIRIN 81 MG PO TBEC [14113]: 81 mg | ORAL | @ 13:00:00 | NDC 00904675180

## 2023-04-09 MED ADMIN — AZITHROMYCIN 500 MG IV SOLR [21063]: 500 mg | INTRAVENOUS | @ 08:00:00 | Stop: 2023-04-20 | NDC 55150017410

## 2023-04-09 MED ADMIN — HEPARIN, PORCINE (PF) 5,000 UNIT/0.5 ML IJ SYRG [95535]: 5000 [IU] | SUBCUTANEOUS | @ 10:00:00 | NDC 63323054303

## 2023-04-09 MED ADMIN — HEPARIN, PORCINE (PF) 5,000 UNIT/0.5 ML IJ SYRG [95535]: 5000 [IU] | SUBCUTANEOUS | @ 20:00:00 | NDC 63323054303

## 2023-04-09 MED ADMIN — LEVOTHYROXINE 25 MCG PO TAB [4420]: 25 ug | ORAL | @ 11:00:00 | NDC 00904694961

## 2023-04-09 MED ADMIN — CEFTRIAXONE INJ 2GM IVP [210254]: 2 g | INTRAVENOUS | @ 20:00:00 | NDC 60505614900

## 2023-04-09 MED ADMIN — METOPROLOL SUCCINATE 25 MG PO TB24 [81866]: 25 mg | ORAL | @ 01:00:00 | NDC 00904632261

## 2023-04-10 ENCOUNTER — Encounter: Admit: 2023-04-10 | Discharge: 2023-04-10 | Payer: MEDICARE

## 2023-04-10 MED ADMIN — HEPARIN, PORCINE (PF) 5,000 UNIT/0.5 ML IJ SYRG [95535]: 5000 [IU] | SUBCUTANEOUS | @ 10:00:00 | Stop: 2023-04-10 | NDC 63323054303

## 2023-04-10 MED ADMIN — SODIUM CHLORIDE 0.9 % IV SOLP [27838]: 500 mg | INTRAVENOUS | @ 07:00:00 | Stop: 2023-04-10 | NDC 00338004902

## 2023-04-10 MED ADMIN — METOPROLOL SUCCINATE 25 MG PO TB24 [81866]: 25 mg | ORAL | @ 01:00:00 | NDC 00904632261

## 2023-04-10 MED ADMIN — TRIAMTERENE-HYDROCHLOROTHIAZID 37.5-25 MG PO TAB [8132]: 1 | ORAL | @ 15:00:00 | Stop: 2023-04-10 | NDC 68084075095

## 2023-04-10 MED ADMIN — HEPARIN, PORCINE (PF) 5,000 UNIT/0.5 ML IJ SYRG [95535]: 5000 [IU] | SUBCUTANEOUS | @ 04:00:00 | NDC 63323054303

## 2023-04-10 MED ADMIN — DULOXETINE 30 MG PO CPDR [93424]: 30 mg | ORAL | @ 14:00:00 | Stop: 2023-04-10 | NDC 00904704461

## 2023-04-10 MED ADMIN — CEFTRIAXONE INJ 2GM IVP [210254]: 2 g | INTRAVENOUS | @ 19:00:00 | Stop: 2023-04-10 | NDC 60505614900

## 2023-04-10 MED ADMIN — LEVOTHYROXINE 25 MCG PO TAB [4420]: 25 ug | ORAL | @ 10:00:00 | Stop: 2023-04-10 | NDC 00904694961

## 2023-04-10 MED ADMIN — MELATONIN 5 MG PO TAB [168576]: 5 mg | ORAL | @ 01:00:00 | NDC 77333052025

## 2023-04-10 MED ADMIN — AZITHROMYCIN 500 MG IV SOLR [21063]: 500 mg | INTRAVENOUS | @ 07:00:00 | Stop: 2023-04-10 | NDC 55150017410

## 2023-04-10 MED ADMIN — ASPIRIN 81 MG PO TBEC [14113]: 81 mg | ORAL | @ 14:00:00 | Stop: 2023-04-10 | NDC 00904675180

## 2023-04-10 MED FILL — LACTULOSE 10 GRAM/15 ML PO LIQUID GROUP: 10 gram/15 mL | ORAL | 7 days supply | Qty: 237 | Fill #1 | Status: CP

## 2023-04-10 MED FILL — AZITHROMYCIN 500 MG PO TAB: 500 mg | ORAL | 6 days supply | Qty: 6 | Fill #1 | Status: CP

## 2023-04-11 ENCOUNTER — Encounter: Admit: 2023-04-11 | Discharge: 2023-04-11 | Payer: MEDICARE

## 2023-04-11 NOTE — Telephone Encounter
Call placed to patient to review Hepatology follow up scheduling. Attempted to schedule patient on Sep 21, 2023 when he is on site for another appt. Unfortunately, Dr Darral Dash APRN will not have clinic this day.     LVM requesting call back to clinic to review scheduling a non urgent follow up in clinic. [phone].

## 2023-04-14 ENCOUNTER — Encounter: Admit: 2023-04-14 | Discharge: 2023-04-14 | Payer: MEDICARE

## 2023-04-14 NOTE — Telephone Encounter
Pt called and left voice mail requesting call back.     Called and spoke with pt and stated that he still has not received his supplies from [facility].  I provided pt with [facility] phone number to get a hold of them.  I told pt he has already made compliance as pt had questions on this.    Laurie Panda RN

## 2023-04-18 ENCOUNTER — Encounter: Admit: 2023-04-18 | Discharge: 2023-04-18 | Payer: MEDICARE

## 2023-04-20 ENCOUNTER — Encounter: Admit: 2023-04-20 | Discharge: 2023-04-20 | Payer: MEDICARE

## 2023-04-20 NOTE — Telephone Encounter
Please send lab orders to [facility] and advise when completed

## 2023-04-20 NOTE — Telephone Encounter
Pt return call routed to Brandi

## 2023-04-20 NOTE — Telephone Encounter
Spoke to [facility], scheduled hospital follow up with Dr. [place] 06/06/23 at 1:20 PM

## 2023-04-21 ENCOUNTER — Encounter: Admit: 2023-04-21 | Discharge: 2023-04-21 | Payer: MEDICARE

## 2023-04-21 NOTE — Telephone Encounter
RN called patient back. States told by someone before leaving to have his labs rechecked by his PCP after completing the antibiotics.  States unable to see PCP and wants to know what labs are needed.  RN to follow up with inpatient coordinators.

## 2023-04-21 NOTE — Telephone Encounter
Lab Orders:    Is the patient in the lab? No   Who is your provider? N. Talaat  Do you know when you labs are due? Said he had to go do labs after his antibiotics  Where are you having you labs done? [facility]  [place]  What is the phone number and fax number for the lab (if not going to [facility])? Did not have fax #  Please call back

## 2023-04-24 NOTE — Telephone Encounter
LVM for PCP office. Hepatology did not have follow up labs needed from his DC. Requested call back from PCP to review recent discharge and ongoing monitoring from PCP.     Recommended non urgent follow up in clinic 3-6 months. LFTs trending down during admit. Will follow up w/ patient once case reviewed w/ PCP.

## 2023-05-02 ENCOUNTER — Encounter: Admit: 2023-05-02 | Discharge: 2023-05-02 | Payer: MEDICARE

## 2023-05-02 DIAGNOSIS — R0609 Other forms of dyspnea: Secondary | ICD-10-CM

## 2023-05-02 DIAGNOSIS — R079 Chest pain, unspecified: Secondary | ICD-10-CM

## 2023-05-02 DIAGNOSIS — I2510 Atherosclerotic heart disease of native coronary artery without angina pectoris: Secondary | ICD-10-CM

## 2023-05-02 DIAGNOSIS — I10 Essential (primary) hypertension: Secondary | ICD-10-CM

## 2023-05-02 DIAGNOSIS — I25119 Atherosclerotic heart disease of native coronary artery with unspecified angina pectoris: Secondary | ICD-10-CM

## 2023-05-02 LAB — CBC
HEMATOCRIT: 36 — ABNORMAL LOW (ref 40.1–51.0)
HEMOGLOBIN: 11 — ABNORMAL LOW (ref 13.7–17.5)
MCH: 31
MCHC: 31 — ABNORMAL LOW (ref 32.3–36.5)
MPV: 10
PLATELET COUNT: 243
RBC COUNT: 3.7 — ABNORMAL LOW (ref 4.63–6.08)
RDW: 13
WBC COUNT: 8

## 2023-05-02 NOTE — Telephone Encounter
2nd attempt to reach PCP office to review DC recs and patient call to our clinic. LVM Requested call back.

## 2023-05-04 ENCOUNTER — Encounter: Admit: 2023-05-04 | Discharge: 2023-05-04 | Payer: MEDICARE

## 2023-05-04 NOTE — Telephone Encounter
[name] w/ PCP office called and reports that they have seen the patient for follow up this week in their clinic. They will manage post DC lab monitoring post recent infection.

## 2023-05-04 NOTE — Telephone Encounter
Return call routed to [facility].

## 2023-05-09 ENCOUNTER — Encounter: Admit: 2023-05-09 | Discharge: 2023-05-09 | Payer: MEDICARE

## 2023-05-09 IMAGING — CR [ID]
3 series · 3 of 3 positions shown · non-contrast
Comparison: none

[knee ap]
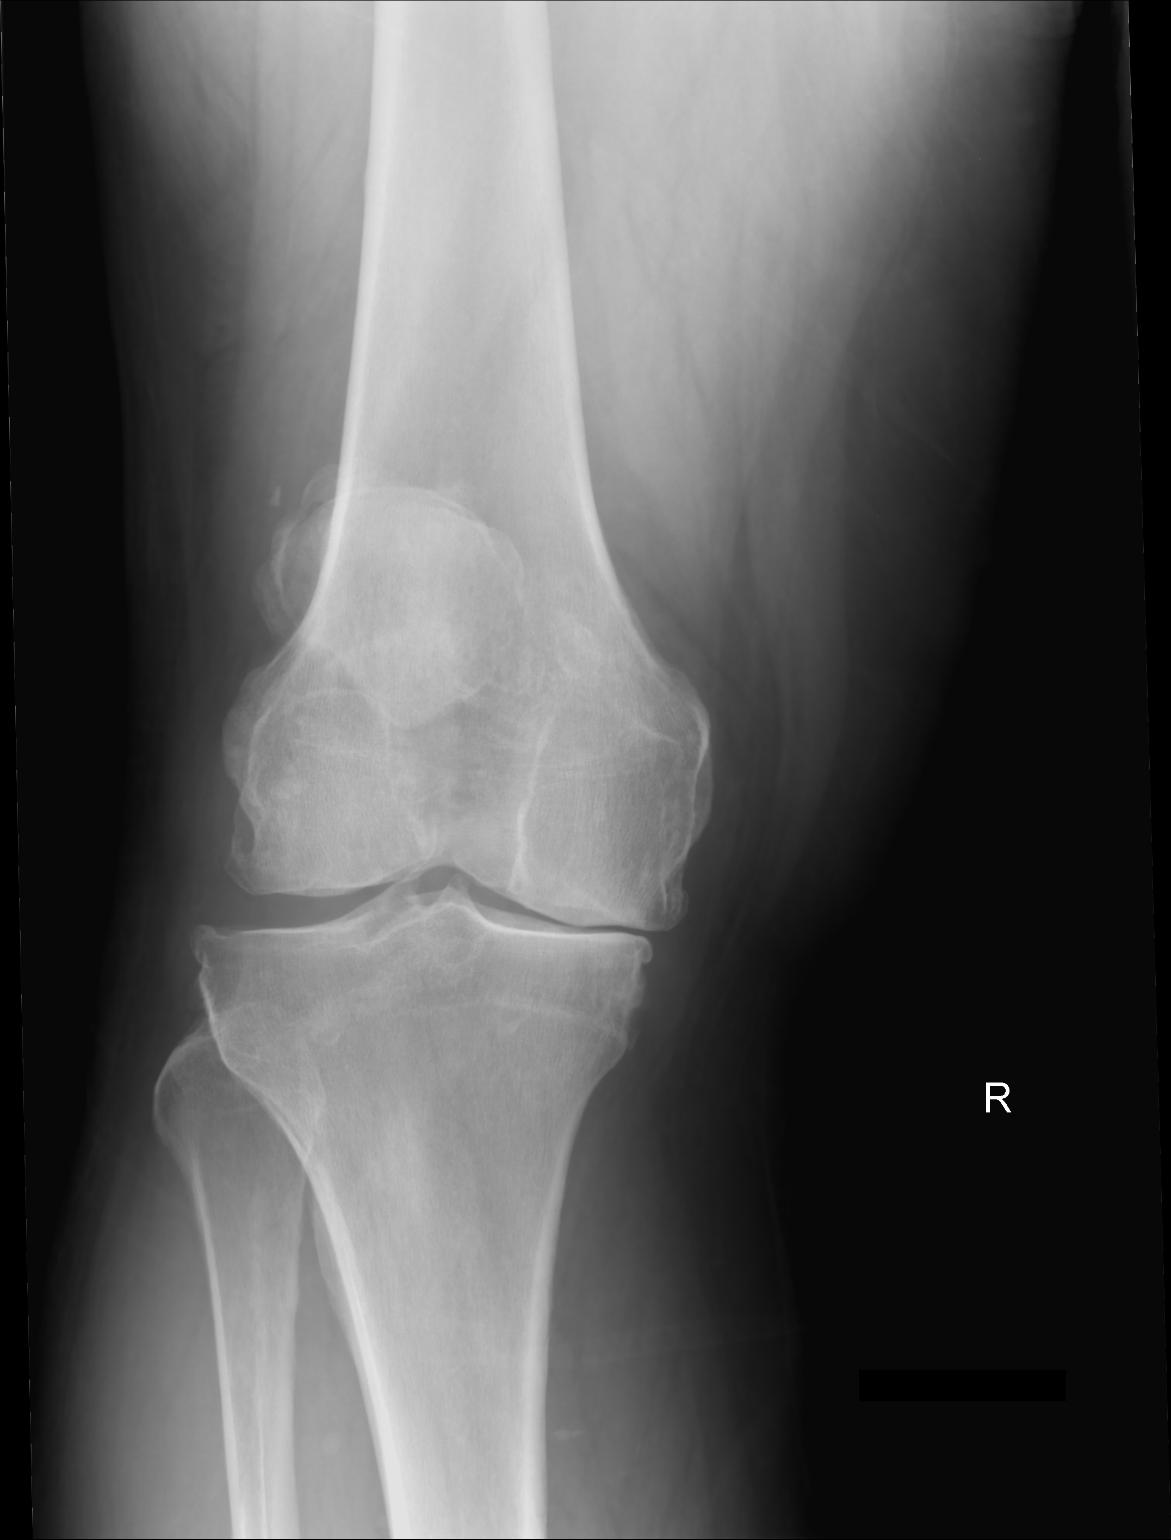

[knee sunrise]
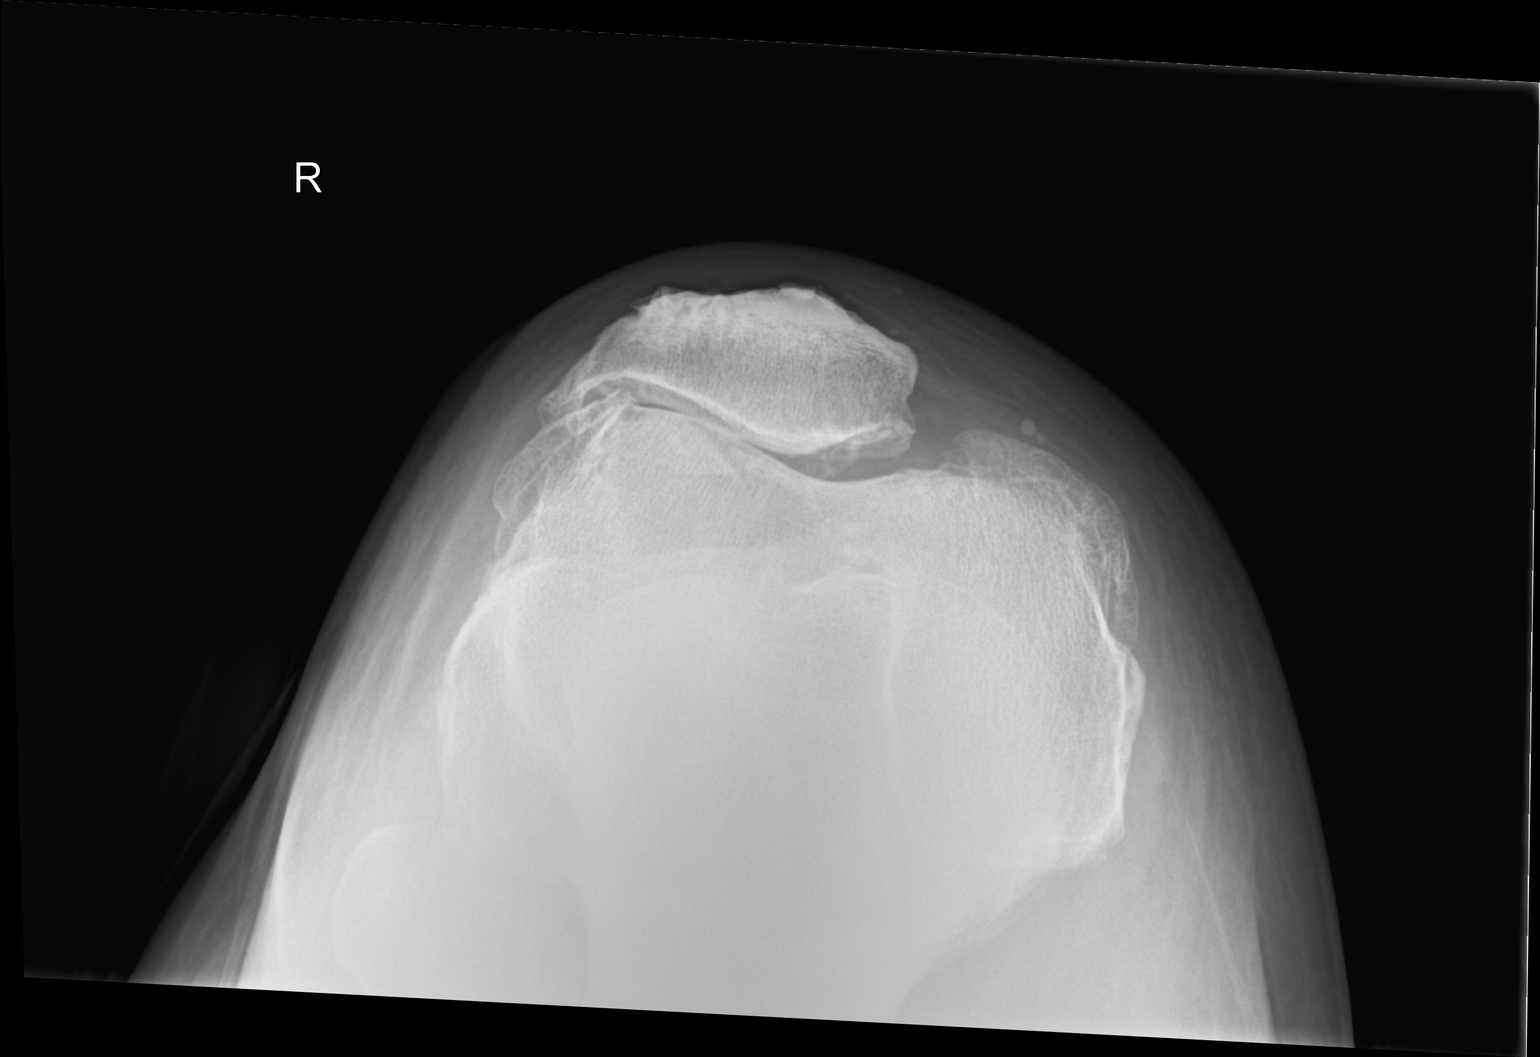

[knee lat]
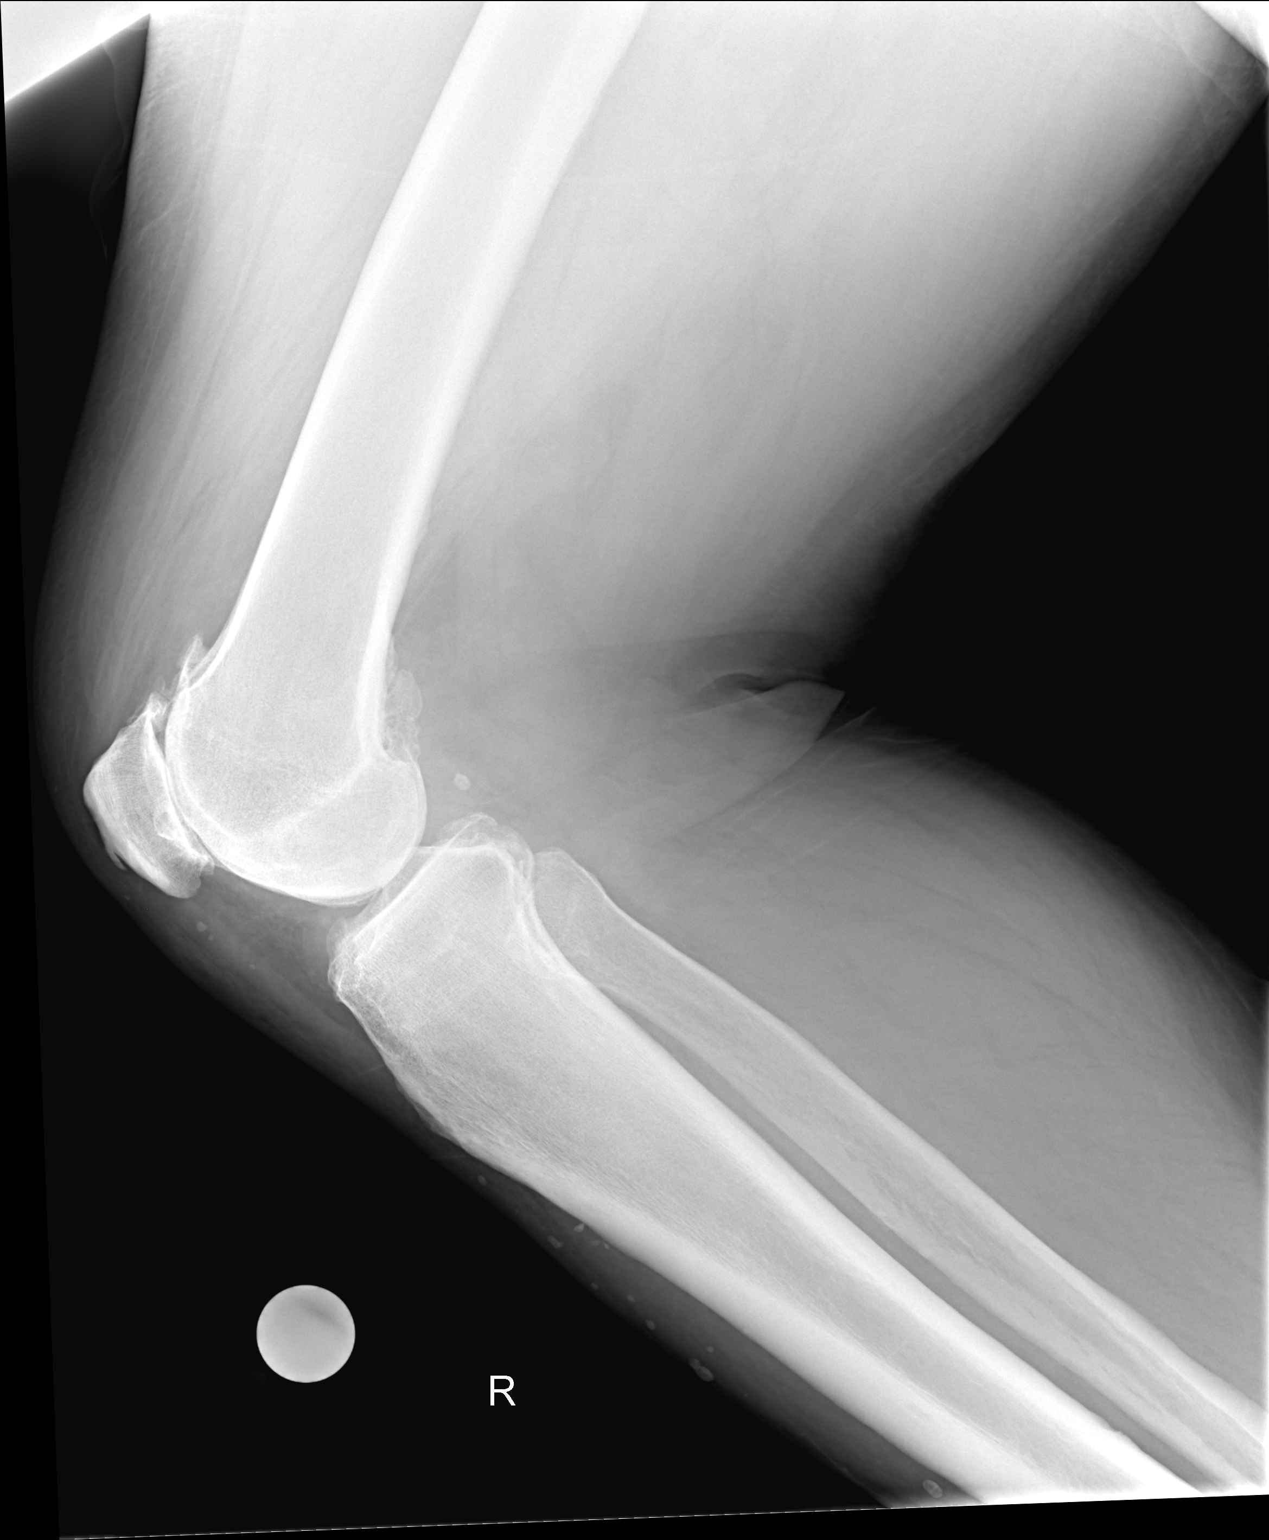

[3 of 3 positions shown; findings below may reference images not displayed]

DIAGNOSTIC STUDIES

EXAM

XR knee RT 3V

INDICATION

R knee pain
Pt c/o right knee pain x years. CF/HB

TECHNIQUE

XR knee RT 3V

COMPARISONS

None

FINDINGS

No acute fracture or dislocation. Severe narrowing of the medial and anterior compartment joint
spaces with osteophyte formation. No joint effusion is present. Overlying soft tissues are within no
rmal limits.

IMPRESSION

No acute osseous findings.

Severe medial and anterior compartment osteoarthritis.

Tech Notes:

Pt c/o right knee pain x years. CF/HB

## 2023-06-06 ENCOUNTER — Encounter: Admit: 2023-06-06 | Discharge: 2023-06-06 | Payer: MEDICARE

## 2023-06-06 ENCOUNTER — Ambulatory Visit: Admit: 2023-06-06 | Discharge: 2023-06-07 | Payer: MEDICARE

## 2023-06-06 DIAGNOSIS — F17200 Nicotine dependence, unspecified, uncomplicated: Secondary | ICD-10-CM

## 2023-06-06 DIAGNOSIS — K760 Fatty (change of) liver, not elsewhere classified: Secondary | ICD-10-CM

## 2023-06-06 DIAGNOSIS — R7989 Other specified abnormal findings of blood chemistry: Secondary | ICD-10-CM

## 2023-06-06 NOTE — Patient Instructions
Reason for Visit  Elevated Liver Enzymes and Non-Alcoholic Fatty Liver Disease (NAFLD)  06/06/23    Patient Information/Education:  Your imaging showed fatty liver, this is the most common cause of liver disease  Fatty Liver treatment is making a lifestyle change and managing any co-morbidities that you may have.  It is recommended to lose 5% of body weight in order to reduce fat in the liver; and 10% is needed to reduce inflammation. Weight loss may be achieved through diet in conjunction with exercise. Goal is to exercise for 45min/day for most days (5-6) of the week, as able.   Your recent labs that you brought were reviewed and are now normal.   Talk to your cardiologist about adjusting your water pills to help with the fluid gain   We will refer you to our weight management clinic           Medication  We recommend you avoid herbal supplements and over the counter herbal remedies due to potential hepatic toxicities.      Fatty Liver Management   Fatty liver disease is when fat deposits in the liver. This can lead to inflammation, and chronic inflammation can ultimately lead to scarring and cirrhosis. The only way to differentiate between benign steatosis (some fat in the liver) vs metabolic dysfunction associated hepatitis (MASH-fatty liver with inflammation) is to perform a liver biopsy.  Try to lose 5-10% body weight in the next 6-12 months to help with the fatty liver deposits.  Work with your primary care provided to keep your blood cholesterol, blood sugar, and blood pressure under control.  We recommend 30-45 minutes of exercise each day. Even if you don't lose weight, it can help your fatty liver disease.  1-2 cups of black coffee per day can help with fatty liver. Any black tea or black coffee is okay.  We also recommend following a Mediterranean Diet.    Notify our office if you experience yellowing of the eyes or skin, dark amber urine, memory loss, confusion, swelling of the extremities or abdomen, or blood in the stool as these can all be complications from liver disease.     General Instructions:  How to reach [place]:   Please send a MyChart message to the Transplant Hepatology clinic or call us at [phone] and ask for Lauren. Our office hours are 8am-4:30pm.  How to get a medication refill:  Please use the MyChart Refill request or contact your pharmacy directly to request medication refills. Please allow 48 hours.     How to receive your test results:  Results are released to your MyChart account immediately, please ensure you are utilizing this tool to review results after your clinic visit. Please allow time for your physician to review your results. If you don't have a MyChart account, you will receive a phone call or letter.  If you are expecting results and have not heard from our office within 10 business days of your testing, please call.  If you have labs, imaging, or admission at another facility please let us know so we can request results.   Scheduling:  Our Scheduling phone number is [phone].    Support groups for many chronic illnesses are available through [facility]: [email or web address] or [phone].    [facility]  Dr. Jan Fireman or Marcelyn Bruins, APRN  Nurse Coordinator: Leotis Shames  [phone]    Check-out:  1. Return to clinic in 1  year  with Marcelyn Bruins, APRN In-person LTX or In-person  Gen Hep.

## 2023-06-06 NOTE — Progress Notes
Hepatology Clinic Note  Patient Name:Jeremy Burnett. - DOB: 03/25/1966- ZOX:[id number] - Date of Service: 06/06/2023         57 y.o. male seen today for Elevated Liver Enzymes and Fatty Liver      Fatty Liver Disease  Chronic condition, no progression or damage noted based on recent labs.   Discussed potential progression to cirrhosis and hepatocellular carcinoma.  Fib-4 = 0.7 (advanced fibrosis unlikely)  Continue to encourage healthy lifestyle changes    Tobacco Use  Recent relapse after 11 weeks of cessation due to stress.  -Encouraged to continue efforts to quit smoking.    Obesity  Recent weight gain after initial weight loss post-hospitalization.   Discussed importance of diet, focusing on lean proteins and vegetables.  -Refer to weight management clinic for additional support and to explore other potential weight loss medication options.    Fluid Retention  Recent increase in weight due to fluid retention after resuming normal fluid intake post-hospitalization.  -Recommended reaching out to cardiologist for potential adjustment of diuretic therapy.    General Health Maintenance  -Negative colonoscopy in 2023.  Next due in 2033.        ICD-9-CM ICD-10-CM    1. Steatosis of liver  571.8 K76.0 AMB REFERRAL TO WEIGHT MANAGEMENT      2. Elevated LFTs  790.6 R79.89       3. Obesity without serious comorbidity, unspecified class, unspecified obesity type  278.00 E66.9 AMB REFERRAL TO WEIGHT MANAGEMENT      4. Smoking  305.1 F17.200          Follow up: Return in about 1 year (around 06/05/2024) for [employer], In-Person.Jan Fireman, MD  Assistant Professor of Medicine  Gastroenterology and Transplant Hepatology  [facility]     Subjective     57 y.o. male who is seen today for Elevated Liver Enzymes and Fatty Liver     The patient, with a history of fatty liver disease, presents for a follow-up visit after a recent hospitalization due to an infection.     During hospitalization in 03/2023 for Pneumonia the patient was noted to have elevated LFTs  His LFTs have improved significantly. A CT scan showed hepatic steatosis    The patient has been struggling with weight loss and smoking cessation. He successfully abstained from smoking for 11 weeks post-hospitalization but had a relapse due to a stressful event.     Patient has made dietary changes, including a high intake of vegetables and lean meats,    The patient has experienced significant weight fluctuations, with a notable loss of 20 pounds post-hospitalization, attributed to fluid loss. However, he have since regained some weight.    04/24/2023: AST 15, ALT 26, ALP 81,Tbil 0.35, Total protein 6.4, Albumin 3.6,  Plts 243, Hbg 11.7,      Today, the patient denies abdominal pain, nausea, vomiting, bowel movement changes, blood in the stool, jaundice, pruritus, fluid retention, day-night reversal and episodes of confusion.   Past Medical History:  Past Medical History:   Diagnosis Date    Abnormal stress test 06/19/2015    Amyloidosis (HCC)        Angina pectoris (HCC) 57 years of age    Arthritis     CAD (coronary artery disease), native coronary artery 05/31/2007    Chest pain     Chronic kidney disease stones    Coronary atherosclerosis     Diverticulitis of colon (without mention of  hemorrhage)(562.11) 1989    Dizziness     DM (diabetes mellitus) (HCC)    2010    Esophageal stricture     Essential hypertension 05/31/2007    GERD (gastroesophageal reflux disease)     Heart attack (HCC)    06/19/2015    stent    HX: anticoagulation asa 81 mg    Hyperlipidemia 05/31/2007    Hypertriglyceridemia     Hypothyroidism 06/19/2015    Irritable bowel disease 1989    Joint pain knee,hips,neck,l shoulder,hands    Kidney stones none since 2003    l kidney removed    Morbid obesity with BMI of 45.0-49.9, adult (HCC)    05/31/2007    Neoplasm of unspecified nature, site unspecified     Osteoporosis     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    ammonium lactate(+) (LAC-HYDRIN) 12 % topical cream Topical, TWICE DAILY    aspirin EC 81 mg tablet 1 tablet, Oral, EVERY MORNING    atorvastatin (LIPITOR) 40 mg, Oral, DAILY    CINNAMON BARK PO 385 mg, Oral, DAILY    cyclobenzaprine (FLEXERIL) 10 mg, Oral, AS NEEDED    diclofenac sodium (VOLTAREN) 1 % topical gel Topical, AS NEEDED    duloxetine DR (CYMBALTA) 30 mg, Oral, DAILY    Garlic 1,000 mg cap 1 capsule, Oral, DAILY    LANTUS U-100 INSULIN 60 Units, Subcutaneous, AT BEDTIME DAILY    levothyroxine (SYNTHROID) 25 mcg, Oral, DAILY BEFORE BREAKFAST    lidocaine (LIDODERM) 5 % topical patch 1 patch, Topical, AS NEEDED    lisinopriL (ZESTRIL) 10 mg, Oral, DAILY    metFORMIN (GLUCOPHAGE) 1,000 mg, Oral, THREE TIMES DAILY, resume on 11/29/20 with evening dose    metoprolol succinate XL (TOPROL XL) 25 mg, Oral, AT BEDTIME DAILY    nitroglycerin (NITRO-DUR) 0.2 mg/hr patch 1 patch, Transdermal, DAILY, 12 hours on, then 12 hours off.    NovoLIN R Regular U100 Insulin 30 Units, Subcutaneous, TWICE DAILY    potassium gluconate 99 mg, Oral, DAILY    triamterene/hydrochlorothiazide (MAXZIDE) 37.5/25 mg tablet 1 tablet, Oral, EVERY MORNING    turmeric root extract 500 mg tab 1 tablet, Oral, DAILY    zolpidem (AMBIEN) 10 mg, Oral, AT BEDTIME PRN     Surgical History:  Surgical History:   Procedure Laterality Date    Left Heart Catheterization With Ventriculogram Left 06/19/2015    Performed by Greig Castilla, MD at [facility] CATH LAB    Coronary Angiography N/A 06/19/2015    Performed by Greig Castilla, MD at [facility] CATH LAB    Possible Percutaneous Coronary Intervention N/A 06/19/2015    Performed by Greig Castilla, MD at [facility] CATH LAB    ANGIOGRAPHY CORONARY ARTERY WITH LEFT HEART CATHETERIZATION, REQUEST RADIAL APPROACH IF POSSIBLE N/A 11/27/2020    Performed by Hajj, Alfredo Martinez, MD at [facility] CATH LAB    POSSIBLE PERCUTANEOUS  CORONARY STENT PLACEMENT WITH ANGIOPLASTY N/A 11/27/2020    Performed by Hajj, Alfredo Martinez, MD at [facility] CATH LAB    ANGIOGRAPHY CORONARY ARTERY WITH LEFT HEART CATHETERIZATION N/A 12/15/2022    Performed by Laney Pastor, MD at [facility] CATH LAB    PERCUTANEOUS CORONARY STENT PLACEMENT WITH ANGIOPLASTY N/A 12/15/2022    Performed by Laney Pastor, MD at [facility] CATH LAB    ABDOMEN SURGERY  2003 kidney 2017gallbladder    CARDIAC CATHERIZATION      CARDIOVASCULAR SCAN      CARDIOVASCULAR STRESS TEST      ECHOCARDIOGRAM PROCEDURE      ELECTROCARDIOGRAM      HX ARTHROSCOPIC SURGERY  right knee 2008    HX CORONARY STENT PLACEMENT      KNEE SURGERY  2008 r    UMBILICAL ARTERIAL CATH - BEDSIDE  x3 stent in 2016     Social History:   reports that he quit smoking about 2 months ago. His smoking use included cigarettes. He started smoking about 33 years ago. He has a 33 pack-year smoking history. He has never used smokeless tobacco. He reports that he does not drink alcohol and does not use drugs.   Family History:  Family History   Problem Relation Name Age of Onset    Coronary Artery Disease Mother nona     Hypertension Mother nona     High Cholesterol Mother nona     Diabetes Mother nona     Arthritis Mother nona     Joint Pain Mother nona     Osteoporosis Mother nona     Coronary Artery Disease Father christophere sr     Hypertension Father Saheed sr     High Cholesterol Father Dispensing optician sr     Diabetes Father Dispensing optician sr     Cancer Father Erling sr     Heart Attack Father rehman sr     Stroke Father Regino sr     Arthritis Father Orville sr     Heart problem Father treysean sr     Neck Pain Father Dispensing optician sr     Neurologic Disorder Father marcos sr         parkinsons    Seizures Father Dispensing optician sr     Diabetes Brother francis     High Cholesterol Brother francis     Hypertension Brother francis      @SUBJECTIVEND @  Objective     Objective   There were no vitals taken for this visit.  Constitutional: Well-developed, well-nourished, in no apparent distress.    HEENT: Normocephalic. No scleral icterus. Moist oral mucosa.   Neck/Lymph: Normal ROM, supple. No thyromegaly.  No lymphadenopathy  Cardiac:  Regular rate and rhythm.  No overt murmurs  Respiratory: Clear to auscultation bilaterally.  No wheezes or rales  GI:  Abdomen soft, non-distended, non-tender. BS present. No shifting dullness. No overt hepatosplenomegaly.     Skin:  Skin is warm and dry. No rash noted.  No jaundice. No spider nevi noted. No palmar erythema  Musculoskeletal:  No lower extremity edema. 2+ pulses in all extremities. ROM intact, No muscle waisting  Psychiatric: Normal mood and affect. Behavior is normal.  Neuro:  No asterixis, No tremor     Labs/Imaging/Procedures and Diagnostic tests:  As reviewed in HPI unless otherwise specified  __________________________________________________________________________________  Total Time Today was 40 minutes in the following activities: Preparing to see the patient, Obtaining and/or reviewing separately obtained history, Performing a medically appropriate examination  and/or evaluation, Counseling and educating the patient/family/caregiver, Ordering medications, tests, or procedures, Documenting clinical information in the electronic or other health record, and Independently interpreting results (not separately reported) and communicating results to the patient/family/caregiver

## 2023-06-07 DIAGNOSIS — E669 Obesity, unspecified: Secondary | ICD-10-CM

## 2023-08-08 ENCOUNTER — Encounter: Admit: 2023-08-08 | Discharge: 2023-08-08 | Payer: MEDICARE

## 2023-08-08 NOTE — Telephone Encounter
An encounter has been created for documentation only (often for preparation of an upcoming appointment or for follow up on orders/imaging) and patient does not need contact RN and did not miss a phone call or appointment.     RN prepped patient information for clinic with Christie Beckers APRN NP on 09/21/23 at 1130 via in person appt.     Here for 6 mo follow up  Dx:OSA  Pt known to sleep department by Dr. Janeece Fitting    Hx of hypertension, hyperlipidemia, diabetes mellitus, hypothyroidism, coronary disease and morbid obesity - hospitalized in April for chest pain and had heart cath.     He smokes 1ppd and has smoked since 22 yrs of age     Pt also followed by cardiology.    Last SS: 10/07/22 with AHI 23.9   Previous cpap 5-15  Noc ox ordered at [facility].     02/08/23 low dose CT chest for lung cancer screening at [facility], [place].      LV: 03/27/23 with Christie Beckers     Pt hospitalized from 7/31-04/10/23 for pyelonephritis.     Primary insomnia   Meds:ambien     ZOX:[id number] Plus  RN obtained download from AV

## 2023-09-20 NOTE — Progress Notes
Date of Service: 09/21/2023    Subjective:             Jeremy Connerton. is a 58 y.o. male.    History of Present Illness  I had the pleasure of seeing Jeremy Burnett in clinic for OSA follow-up. This is a CPAP compliance visit. He is known to Dr. Evonnie Pat. He has moderate OSA (AHI 23.9) and is on CPAP 5-15.  He is working on wearing it but struggles with moving from side to side. He is wearing a hybrid full face mask.  He is doing ok with therapy  Frustrating with DME because of supplies, has bought some online  Arthritis disrupts his sleep a lot, Lortab twice daily   Ambien 10mg  as needed     Still smoking, did quit for a time after hospitalized last summer. Hospitalized 7/31-8/5 and was diagnosed with Legionella pneumonia   Has had a lot going on recently         09/21/2023     6:18 AM 03/27/2023    12:20 PM 01/02/2023     7:29 PM   Epworth Sleepiness Scale   Sitting and reading 1 0 1   Watching TV 1 0 1   Sitting inactive in a public place (e.g. a theater or a meeting) 0 0 1   As a passenger in a car for an hour without a break 1 0 1   Lying down to rest in the afternoon when circumstances permit 2 3 3    Sitting and talking to someone 0 0 0   Sitting quietly after a lunch without alcohol 1 0 0   In a car, while stopped for a few minutes in traffic 0 0 0   Epworth Sleepiness Scale Score 6 3 7                    Objective:         acetaminophen-codeine (TYLENOL-CODEINE #3) 300-30 mg tablet Take two tablets by mouth daily. Max of 4,000 mg of acetaminophen in 24 hours.    ammonium lactate(+) (LAC-HYDRIN) 12 % topical cream Apply  topically to affected area twice daily.    aspirin EC 81 mg tablet Take one tablet by mouth every morning.    atorvastatin (LIPITOR) 40 mg tablet Take one tablet by mouth daily.    CINNAMON BARK PO Take 385 mg by mouth daily.    cyclobenzaprine (FLEXERIL) 10 mg tablet Take one tablet by mouth as Needed.    diclofenac sodium (VOLTAREN) 1 % topical gel Apply  topically to affected area as Needed. duloxetine DR (CYMBALTA) 30 mg capsule Take one capsule by mouth daily.    Garlic 1,000 mg cap Take one capsule by mouth daily.    LANTUS U-100 INSULIN 100 unit/mL subcutaneous solution Inject sixty Units under the skin at bedtime daily.    levothyroxine (SYNTHROID) 25 mcg tablet Take one tablet by mouth daily 30 minutes before breakfast.    lidocaine (LIDODERM) 5 % topical patch Apply one patch topically to affected area as Needed.    lisinopril (PRINIVIL; ZESTRIL) 10 mg tablet Take one tablet by mouth daily.    metFORMIN (GLUCOPHAGE) 1,000 mg tablet Take one tablet by mouth three times daily. resume on 11/29/20 with evening dose    metoprolol succinate XL (TOPROL XL) 25 mg extended release tablet Take one tablet by mouth at bedtime daily. Indications: chronic heart failure    nitroglycerin (NITRO-DUR) 0.2 mg/hr patch Apply one patch to  top of skin as directed daily. 12 hours on, then 12 hours off.    NOVOLIN R REGULAR U100 INSULIN 100 unit/mL injection solution Inject thirty Units under the skin twice daily.    Potassium Gluconate 595 mg (99 mg) tab Take 99 mg by mouth daily.    triamterene/hydrochlorothiazide (MAXZIDE) 37.5/25 mg tablet Take one tablet by mouth every morning.    turmeric root extract 500 mg tab Take one tablet by mouth daily.    zolpidem (AMBIEN) 10 mg tablet Take one tablet by mouth at bedtime as needed for Sleep.     Vitals:    09/21/23 1153   BP: 119/71   BP Source: Arm, Right Upper   Pulse: 107   Temp: 36.9 ?C (98.4 ?F)   Resp: 18   SpO2: 97%   PainSc: Four   Weight: (!) 145.2 kg (320 lb)   Height: 175.3 cm (5' 9)     Body mass index is 47.26 kg/m?Marland Kitchen     Physical Exam  Vitals reviewed.   Constitutional:       Appearance: Normal appearance.   Pulmonary:      Effort: Pulmonary effort is normal. No respiratory distress.   Skin:     General: Skin is dry.   Neurological:      Mental Status: He is alert and oriented to person, place, and time.   Psychiatric:         Mood and Affect: Mood normal. Behavior: Behavior normal.         Thought Content: Thought content normal.         Judgment: Judgment normal.                Assessment and Plan:    Problem   Obstructive Sleep Apnea Syndrome    HST 10/07/2022  BMI 47 318lbs  AHI of 23.9 supine 24.6  O2 low 69.9%, time < 88% 21 minutes.    Pt set up with cPAP @ 5-15cm H2O on 01/05/23.  DME: [facility]  Insurance: [employer]   If the patient has to meet compliance, their compliance window will be from 02/05/23 to 04/05/23.           Obstructive sleep apnea syndrome  Reviewed CPAP download. 19/30 days of usage.   Wearing 4 hours or greater 47% of nights. The average use on days used was 4 hours and 46 minutes.  Residual AHI 2.6.    The 95th percentile pressure was 14.3 cmH20, with a maximum of 14.6 cmH20, and a median of 11.8 cmH20.  The median leak was 25.8 L/min. He is tolerating and benefiting from therapy.     - Will continue current settings, CPAP 5-15. Will work on making sure mask is fitting well. Noc ox reordered.     - Keep in contact with DME for routine supplies, need to contact us directly if unable to communicate with DME in a timely manner.     He has our contact information and was encouraged to call us with any questions or concerns.    RTC in 6 months    Orders Placed This Encounter    AMB REFERRAL TO HOME CARE     Future Appointments   Date Time Provider Department Center   03/19/2024  9:00 AM Irven Easterly, APRN-NP [facility] IM   06/10/2024  3:30 PM Reggie Pile, APRN-NP [facility] [place]     Total time- 25 minutes

## 2023-09-21 ENCOUNTER — Ambulatory Visit: Admit: 2023-09-21 | Discharge: 2023-09-21 | Payer: MEDICARE

## 2023-09-21 ENCOUNTER — Encounter: Admit: 2023-09-21 | Discharge: 2023-09-21 | Payer: MEDICARE

## 2023-09-21 VITALS — BP 119/71 | HR 107 | Temp 98.40000°F | Resp 18 | Ht 69.0 in | Wt 320.0 lb

## 2023-09-21 DIAGNOSIS — G4733 Obstructive sleep apnea (adult) (pediatric): Principal | ICD-10-CM

## 2023-09-21 NOTE — Patient Instructions
 If you have questions or concerns, please call my nurse at [phone] or feel free to send me a mychart message

## 2023-09-22 ENCOUNTER — Encounter: Admit: 2023-09-22 | Discharge: 2023-09-22 | Payer: MEDICARE

## 2023-09-22 NOTE — Telephone Encounter
Order for noc ox on cpap  placed as discussed per office note on 09/21/23    DME: [facility]  Will continue to follow up.

## 2023-09-27 ENCOUNTER — Encounter: Admit: 2023-09-27 | Discharge: 2023-09-27 | Payer: MEDICARE

## 2023-10-04 ENCOUNTER — Encounter: Admit: 2023-10-04 | Discharge: 2023-10-04 | Payer: MEDICARE

## 2023-10-04 NOTE — Telephone Encounter
10/04/23 Sleep records faxed to DME, [facility] per their request.

## 2023-10-04 NOTE — Progress Notes
CT Chest dated 04/05/2023 sent to PCP on file. Report recommends a CXR vs CT Chest.

## 2023-10-20 ENCOUNTER — Encounter: Admit: 2023-10-20 | Discharge: 2023-10-20 | Payer: MEDICARE

## 2023-10-20 NOTE — Telephone Encounter
 [facility]OV 09/21/23. Pressure set at 5-15

## 2023-10-27 ENCOUNTER — Encounter: Admit: 2023-10-27 | Discharge: 2023-10-27 | Payer: MEDICARE

## 2024-01-25 ENCOUNTER — Encounter: Admit: 2024-01-25 | Discharge: 2024-01-25 | Payer: MEDICARE

## 2024-01-25 DIAGNOSIS — M25561 Pain in right knee: Secondary | ICD-10-CM

## 2024-01-29 ENCOUNTER — Encounter: Admit: 2024-01-29 | Discharge: 2024-01-29 | Payer: MEDICARE

## 2024-01-30 ENCOUNTER — Encounter: Admit: 2024-01-30 | Discharge: 2024-01-30 | Payer: MEDICARE

## 2024-01-30 ENCOUNTER — Ambulatory Visit: Admit: 2024-01-30 | Discharge: 2024-01-31 | Payer: MEDICARE

## 2024-01-30 ENCOUNTER — Ambulatory Visit: Admit: 2024-01-30 | Discharge: 2024-01-30 | Payer: MEDICARE

## 2024-02-01 ENCOUNTER — Encounter: Admit: 2024-02-01 | Discharge: 2024-02-01 | Payer: MEDICARE

## 2024-02-01 NOTE — Telephone Encounter
 Referral received from Dr. Emilia Harbour re: bilateral knee OA. Patient with a history of CKD, DM2, heart stents, left RCC with knee pain since 2008. Currently, right knee pain is greater than left knee pain.     Patient is high risk for any surgical intervention at this time given his comorbidities including smoking, obesity, CKD. He can only do steroid injections 1 x yearly due to DM2, and cannot take NSAIDs d/t CKD. He currently takes Lortab to manage pain.    X rays 01/30/24:  IMPRESSION   1. Severe arthritis of bilateral knee medial compartments and lateral   patellofemoral compartments.     2.  Trace bilateral effusions. Tricompartmental osteophyte formation.     Imaging reviewed by Dr. Kathyrn Parkinson, okay to schedule consult. Patient scheduled for 6/6.    Radene Buffalo, RN BSN

## 2024-02-06 ENCOUNTER — Encounter: Admit: 2024-02-06 | Discharge: 2024-02-06 | Payer: MEDICARE

## 2024-02-06 NOTE — Telephone Encounter
 An encounter has been created for documentation only (often for preparation of an upcoming appointment or for follow up on orders/imaging) and patient does not need contact RN and did not miss a phone call or appointment.     RN prepped patient information for clinic with [name] APRN NP on 03/26/24 at 0930 via telehealth.     Here for 6 mo follow up  Dx:OSA  Jeremy Burnett known to sleep department by Dr. Daphney Eans     Hx of hypertension, hyperlipidemia, diabetes mellitus, hypothyroidism, coronary disease and morbid obesity - hospitalized in April for chest pain and had heart cath.     He smokes 1ppd and has smoked since 47 yrs of age     Jeremy Burnett also followed by cardiology.     Last SS: 10/07/22 with AHI 23.9   Noc ox done 10/11/23  Previous cpap 5-15     LV: 09/21/23 with [name]     Primary insomnia   Meds:ambien     ZOX:[id number] Plus  RN obtained download from AV

## 2024-02-09 ENCOUNTER — Encounter: Admit: 2024-02-09 | Discharge: 2024-02-09 | Payer: MEDICARE

## 2024-02-09 ENCOUNTER — Ambulatory Visit: Admit: 2024-02-09 | Discharge: 2024-02-10 | Payer: MEDICARE

## 2024-02-09 DIAGNOSIS — M1909 Primary osteoarthritis, other specified site: Secondary | ICD-10-CM

## 2024-02-09 DIAGNOSIS — Z01818 Encounter for other preprocedural examination: Secondary | ICD-10-CM

## 2024-02-09 DIAGNOSIS — M1711 Unilateral primary osteoarthritis, right knee: Secondary | ICD-10-CM

## 2024-02-09 DIAGNOSIS — M25561 Pain in right knee: Secondary | ICD-10-CM

## 2024-02-09 DIAGNOSIS — M1712 Unilateral primary osteoarthritis, left knee: Secondary | ICD-10-CM

## 2024-02-09 DIAGNOSIS — M25562 Pain in left knee: Secondary | ICD-10-CM

## 2024-02-09 NOTE — Progress Notes
 Date of Service: 02/09/2024              CC: Knee Pain    History of Present Illness    HPI: 58 yo male with PMH Sig for CAD and PCI, HTN, HLD, DM, Obesity, Tobacco Use, OSA, presenting with chronic bilateral knee pain. He describes chronic knee pain, pain is symmetric and worst in the anterior knee.  His pain is refractory to management with steroid injections, anti-inflammatory creams, and oral pain medications. Pain level reaches 10/10 on the right and 10/10 on the left. Comorbidities limit use of steroids (DM) and NSAIDS (CKD). He has been evaluated by orthopedic surgery and is felt to be a poor surgical candidate due to comorbidities. Baseline renal function shows creatinine of 1.2.    Past Medical History:    Abnormal stress test    Amyloidosis (CMS-HCC)    Angina pectoris    Arthritis    CAD (coronary artery disease), native coronary artery    Cancer (CMS-HCC)    Chest pain    Chronic kidney disease    Coronary atherosclerosis    Diverticulitis of colon (without mention of hemorrhage)(562.11)    Dizziness    DM (diabetes mellitus) (CMS-HCC)    Esophageal stricture    Essential hypertension    GERD (gastroesophageal reflux disease)    Heart attack (CMS-HCC)    HX: anticoagulation    Hyperlipidemia    Hypertriglyceridemia    Hypothyroidism    Irritable bowel disease    Joint pain    Kidney stones    Morbid obesity with BMI of 45.0-49.9, adult (CMS-HCC)    Neoplasm of unspecified nature, site unspecified    Osteoporosis    Other malignant neoplasm without specification of site    PONV (postoperative nausea and vomiting)    Rheumatoid arthritis (CMS-HCC)    Sleep apnea    Spinal headache    Syncope and collapse    Tachyarrhythmia    Tobacco abuse    Unspecified cardiovascular disease    Unspecified endocrine disorder    Varicose veins       Surgical History:   Procedure Laterality Date    Left Heart Catheterization With Ventriculogram Left 06/19/2015    Performed by Avelino Lek, MD at [facility] CATH LAB Coronary Angiography N/A 06/19/2015    Performed by Avelino Lek, MD at [facility] CATH LAB    Possible Percutaneous Coronary Intervention N/A 06/19/2015    Performed by Avelino Lek, MD at [facility] CATH LAB    ANGIOGRAPHY CORONARY ARTERY WITH LEFT HEART CATHETERIZATION, REQUEST RADIAL APPROACH IF POSSIBLE N/A 11/27/2020    Performed by Hajj, Wilhemenia Hard, MD at [facility] CATH LAB    POSSIBLE PERCUTANEOUS CORONARY STENT PLACEMENT WITH ANGIOPLASTY N/A 11/27/2020    Performed by Hajj, Wilhemenia Hard, MD at [facility] CATH LAB    ANGIOGRAPHY CORONARY ARTERY WITH LEFT HEART CATHETERIZATION N/A 12/15/2022    Performed by Davonna Estes, MD at [facility] CATH LAB    PERCUTANEOUS CORONARY STENT PLACEMENT WITH ANGIOPLASTY N/A 12/15/2022    Performed by Davonna Estes, MD at [facility] CATH LAB    ABDOMEN SURGERY  2003 kidney 2017gallbladder    CARDIAC CATHERIZATION      CARDIOVASCULAR SCAN      CARDIOVASCULAR STRESS TEST      ECHOCARDIOGRAM PROCEDURE      ELECTROCARDIOGRAM      FRACTURE SURGERY  58 years of age    l shoulder c7 seperation    HX ARTHROSCOPIC SURGERY  right  knee 2008    HX CHOLECYSTECTOMY      HX CORONARY STENT PLACEMENT      HX HEART CATHETERIZATION      KNEE SURGERY  2008 r    SHOULDER SURGERY      x 4 times colarbone    UMBILICAL ARTERIAL CATH - BEDSIDE  x3 stent in 2016       Allergies:  Allergies   Allergen Reactions    Oxycodone HIVES    Ibuprofen SEE COMMENTS     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 Max of 4,000 mg of acetaminophen in 24 hours.    ammonium lactate(+) (LAC-HYDRIN) 12 % topical cream Apply  topically to affected area twice daily.    aspirin EC 81 mg tablet Take one tablet by mouth every morning.    atorvastatin (LIPITOR) 40 mg tablet Take one tablet by mouth daily.    CINNAMON BARK PO Take 385 mg by mouth daily.    cyclobenzaprine (FLEXERIL) 10 mg tablet Take one tablet by mouth as Needed.    diclofenac sodium (VOLTAREN) 1 % topical gel Apply  topically to affected area as Needed.    duloxetine DR (CYMBALTA) 30 mg capsule Take one capsule by mouth daily.    Garlic 1,000 mg cap Take one capsule by mouth daily.    isosorbide mononitrate ER (IMDUR) 60 mg ER tablet     LANTUS U-100 INSULIN 100 unit/mL subcutaneous solution Inject sixty Units under the skin at bedtime daily.    levothyroxine (SYNTHROID) 25 mcg tablet Take one tablet by mouth daily 30 minutes before breakfast.    lidocaine (LIDODERM) 5 % topical patch Apply one patch topically to affected area as Needed.    lisinopril (PRINIVIL; ZESTRIL) 10 mg tablet Take one tablet by mouth daily.    metFORMIN (GLUCOPHAGE) 1,000 mg tablet Take one tablet by mouth three times daily. resume on 11/29/20 with evening dose    metoprolol succinate XL (TOPROL XL) 25 mg extended release tablet Take one tablet by mouth at bedtime daily. Indications: chronic heart failure    nitroglycerin (NITRO-DUR) 0.2 mg/hr patch Apply one patch to top of skin as directed daily. 12 hours on, then 12 hours off.    NOVOLIN R REGULAR U100 INSULIN 100 unit/mL injection solution Inject thirty Units under the skin twice daily.    Potassium Gluconate 595 mg (99 mg) tab Take 99 mg by mouth daily.    triamterene/hydrochlorothiazide (MAXZIDE) 37.5/25 mg tablet Take one tablet by mouth every morning.    turmeric root extract 500 mg tab Take one tablet by mouth daily.    zolpidem (AMBIEN) 10 mg tablet Take one tablet by mouth at bedtime as needed for Sleep.       Social History:   reports that Coleby has been smoking cigarettes. Allante started smoking about 34 years ago. Kayshawn has a 36 pack-year smoking history. [place] has never used smokeless tobacco. Wilfrid reports that Rembert does not drink alcohol and does not use drugs.    Family History   Problem Relation Name Age of Onset    Coronary Artery Disease Mother nona     Hypertension Mother nona     High Cholesterol Mother nona  Diabetes Mother nona     Arthritis Mother nona     Joint Pain Mother nona     Osteoporosis Mother nona     Coronary Artery Disease Father nezar sr     Hypertension Father Caelan sr     High Cholesterol Father Dispensing optician sr     Diabetes Father Dispensing optician sr     Cancer Father Hosie sr     Heart Attack Father macarthur sr     Stroke Father Rawlins sr     Arthritis Father Jedd sr     Heart problem Father ojani sr     Neck Pain Father Dispensing optician sr     Neurologic Disorder Father delron sr         parkinsons    Seizures Father Dispensing optician sr     Diabetes Brother francis     High Cholesterol Brother francis     Hypertension Brother francis        Review of Systems            There were no vitals filed for this visit.  There is no height or weight on file to calculate BMI.     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        Assessment and Plan:    Assessment: 58 yo male with multiple comorbidities presenting with chronic osteoarthritic knee pain refractory to conservative management. Pain is symmetric. He is a poor surgical candidate.    Plan: Bilateral GAE, starting with treatment of the left knee via right CFA access.  Given extensive comorbidities, we will have him seen in PreAnesthesia Clinic and likely perform the procedure under MAC.  Return after one month to treat the contralateral side.    The risks, benefits, and alternatives were discussed with the patient.     Hermine Loots MD    Total time 30 minutes.  Estimated counseling time 15 minutes.  Counseled patient regarding disease process and procedural expectations.     No diagnosis found.

## 2024-02-09 NOTE — Telephone Encounter
 Jeremy Burnett,    Thank you for choosing [facility] Interventional Radiology for your procedure. Your appointment information is listed below:    Appointment Date: Tuesday, March 05, 2024  Arrival Time: 10:30 am  Location:  Main Campus: [address], [place]  [place], [place]  [address]  Parking: [address] Parking Garage  2nd floor, Interventional Radiology - Please check in here.    INTERVENTIONAL RADIOLOGY  PRE-PROCEDURE INSTRUCTIONS    You are scheduled for a geniculate artery embolization in Interventional Radiology with Dr. Hermine Loots. Please follow these instructions and any direction from your Primary Care/Managing Physician. If you have questions about your procedure or need to reschedule please call [phone].    Medication Instructions:   Follow the medication instructions that were given to you at the pre-operative assessment clinic. They will be contacting you to schedule this appointment. These instructions will be provided at that appointment.    Diet Instructions:  Follow the food and drink instructions that were given to you at the pre-operative assessment clinic. These instructions will also be provided at that appointment.    Lab Instructions:  Please complete the pre-procedure lab work prior to your procedure. I have faxed the INR order to the [facility], for you to complete when you go in to do your labs ordered by Dr. Marrie Sizer. If you have any issues getting these done, please let me know as soon as possible. Please try to have these done by June 23 or June 24.    Day of Exam Instructions:  Bathe or shower with an antibacterial soap prior to your appointment.  Bring a list of your current medications and the dosages.  Wear comfortable clothing and leave valuables at home.  Your arrival time is above. This time will be spent registering, interviewing, assessing, educating and preparing you for the procedure.  You will be sedated for the procedure. A responsible adult must drive you home (no [place], taxis or buses are allowed) and stay with you overnight. If you do not have a driver we will be unable to perform your procedure.   You will not be able to return to work or drive the same day if receiving sedation.    Below are your discharge instructions, for your review prior to your procedure:    INTERVENTIONAL RADIOLOGY?   Discharge Instructions   Genicular Artery Embolization   Genicular Artery Embolization (GAE) is a minmally invasive procedure that can treat moderate to severe knee pain caused by arthritis.  A small catheter is inserted into the groin and guided to the arteries around the knee.  Microscopic beads are used to block abnormal blood vessels in order to decrease inflammation and pain.      AFTER YOUR PROCEDURE:   You will recover in Interventional Radiology for 2-6 hours.  You will remain on bedrest but will be allowed to have your head elevated slightly and turn side to side with assistance. ??     POST-PROCEDURE PAIN:   Pain control following your procedure is a priority for both you and your care team.   Some soreness or tenderness at the site is to be expected for several days. We recommend taking over the counter analgesics to help relieve this pain.   Alternative methods for pain relief include but not limited to heat or cold compress, relaxation techniques, rest, and changing of positions.   If pain continues after 5-7 days or you have severe pain not relieved by medication, please  contact us  as directed below.   If you are prescribed medication, please take as directed.?     POST-PROCEDURE ACTIVITY:   If you receive sedation, narcotic pain medication or anesthesia for the procedure, you should not drive or operate heavy machinery or do anything that requires concentration for at least 24 hours after procedure completion.   It is recommended that a responsible person remain with you until morning.   Avoid lifting over 10 lbs., pushing, pulling, or straining for 7-10 days.   Avoid excessive bending, stooping, or stair climbing for 2 days.? It is okay to go upstairs or bend over but take it slowly and keep it to a minimum.?   You may be up and about while relaxing at home as you recover.??     POST-PROCEDURE SITE CARE:   You will have a small bandage over the procedure site.? Keep this dry.?   You may remove it in 24 hours.   You may shower in 24 hours, after removing the bandage.   Do not submerge the procedure site for 1 week (no bathtub, swimming, hot tub, etc.)   Do not use ointments, creams, or powders on the puncture site.   Be sure your hands are clean when touching near the site.   Inspect the site daily.??     DIET/MEDICATIONS:   Jeremy Burnett may resume your previous diet after the procedure.   Drink at least 8 glasses of water a day for the next 24-48 hours (unless contraindicated by kidney or heart disease).  This will flush the x-ray contrast through your kidneys.     If you receive sedation or narcotic pain medications, avoid any foods or beverages containing alcohol for at least 24 hours after the procedure.   Please refer to your After Visit Summary for instructions regarding resuming home medications.??     SYMPTOMS YOU MAY NOTICE   You may experience itching, redness, or skin discoloration over the treated area.    It is normal to have a low-grade fever less than 101.742F (38.6C) for 4-7 days. Please call if you develop a fever over 101.742F (38.6C).   Tingling, numbness, burning, prickling, itching, or ?pins and needles? feeling to the bottom of your foot.     You may have some tenderness and/or bruising around the puncture site initially. Bruising can take 2-3 weeks to go away completely.       WHEN TO CALL THE DOCTOR:? ? ? ??   If you have significant bleeding (more than a teaspoon) or swelling at the site (bigger than a golf ball), lie down, apply firm pressure to the site and call 911. Bleeding from a large vessel requires professional help.   If you have severe pain unrelieved by medication.? It is common to have mild soreness or slight swelling at the puncture site for 1-2 weeks.   If you have numbness, tingling, or weakness in the leg below the puncture site or your leg becomes cold and pale.   If you are unable to urinate (and do not have a urinary catheter in place).   If you have persistent nausea or vomiting.   You have signs of infection such as: fever greater than 101F, chills, redness, warmth, swelling, drainage, or pus from the puncture site.     For any of the above symptoms or for problems or concerns related to the procedure that was performed at the [facility], [address], call [phone] Monday-Friday from 7-5p.? After-hours and weekends, please call  [phone] and ask for the Interventional Radiology Resident on-call.     YOU OR YOUR CAREGIVER SHOULD CALL 911 FOR ANY SEVERE SYMPTOMS SUCH AS EXCESSIVE BLEEDING, SEVERE DIZZINESS, TROUBLE BREATHING OR LOSS OF CONSCIOUSNESS.

## 2024-02-10 DIAGNOSIS — M170 Bilateral primary osteoarthritis of knee: Secondary | ICD-10-CM

## 2024-02-19 ENCOUNTER — Encounter: Admit: 2024-02-19 | Discharge: 2024-02-19 | Payer: MEDICARE

## 2024-02-21 ENCOUNTER — Encounter: Admit: 2024-02-21 | Discharge: 2024-02-21 | Payer: MEDICARE

## 2024-02-21 DIAGNOSIS — M1711 Unilateral primary osteoarthritis, right knee: Secondary | ICD-10-CM

## 2024-02-21 DIAGNOSIS — M25561 Pain in right knee: Secondary | ICD-10-CM

## 2024-02-21 DIAGNOSIS — M1712 Unilateral primary osteoarthritis, left knee: Secondary | ICD-10-CM

## 2024-02-21 NOTE — Telephone Encounter
 Jeremy Burnett,     Thank you for choosing [facility] Interventional Radiology for your procedure. Your appointment information is listed below:     Appointment Date: Wednesday, March 27, 2024  Arrival Time: 10:30 am  Location:  Main Campus: [address], [place]  [place], [place]  [address]  Parking: [address]3 Parking Garage  2nd floor, Interventional Radiology - Please check in here.     INTERVENTIONAL RADIOLOGY  PRE-PROCEDURE INSTRUCTIONS     You are scheduled for a geniculate artery embolization in Interventional Radiology with Dr. Penne Boehringer. Please follow these instructions and any direction from your Primary Care/Managing Physician. If you have questions about your procedure or need to reschedule please call [phone].     Medication Instructions:   Follow the medication instructions that were given to you at the pre-operative assessment clinic. They will be contacting you to schedule this appointment. These instructions will be provided at that appointment.     Diet Instructions:  Follow the food and drink instructions that were given to you at the pre-operative assessment clinic. These instructions will also be provided at that appointment.     Lab Instructions:  Please complete the pre-procedure lab work prior to your procedure. I have faxed the INR order to the [facility], for you to complete when you go in to do your labs ordered by Dr. Landy. If you have any issues getting these done, please let me know as soon as possible.     Day of Exam Instructions:  Bathe or shower with an antibacterial soap prior to your appointment.  Bring a list of your current medications and the dosages.  Wear comfortable clothing and leave valuables at home.  Your arrival time is above. This time will be spent registering, interviewing, assessing, educating and preparing you for the procedure.  You will be sedated for the procedure. A responsible adult must drive you home (no Gisele, taxis or buses are allowed) and stay with you overnight. If you do not have a driver we will be unable to perform your procedure.   You will not be able to return to work or drive the same day if receiving sedation.     Below are your discharge instructions, for your review prior to your procedure:     INTERVENTIONAL RADIOLOGY?   Discharge Instructions   Genicular Artery Embolization   Genicular Artery Embolization (GAE) is a minmally invasive procedure that can treat moderate to severe knee pain caused by arthritis.  A small catheter is inserted into the groin and guided to the arteries around the knee.  Microscopic beads are used to block abnormal blood vessels in order to decrease inflammation and pain.       AFTER YOUR PROCEDURE:   You will recover in Interventional Radiology for 2-6 hours.  You will remain on bedrest but will be allowed to have your head elevated slightly and turn side to side with assistance. ??      POST-PROCEDURE PAIN:   Pain control following your procedure is a priority for both you and your care team.   Some soreness or tenderness at the site is to be expected for several days. We recommend taking over the counter analgesics to help relieve this pain.   Alternative methods for pain relief include but not limited to heat or cold compress, relaxation techniques, rest, and changing of positions.   If pain continues after 5-7 days or you have severe pain not relieved by medication, please  contact us  as directed below.   If you are prescribed medication, please take as directed.?      POST-PROCEDURE ACTIVITY:   If you receive sedation, narcotic pain medication or anesthesia for the procedure, you should not drive or operate heavy machinery or do anything that requires concentration for at least 24 hours after procedure completion.   It is recommended that a responsible person remain with you until morning.   Avoid lifting over 10 lbs., pushing, pulling, or straining for 7-10 days.   Avoid excessive bending, stooping, or stair climbing for 2 days.? It is okay to go upstairs or bend over but take it slowly and keep it to a minimum.?   You may be up and about while relaxing at home as you recover.??      POST-PROCEDURE SITE CARE:   You will have a small bandage over the procedure site.? Keep this dry.?   You may remove it in 24 hours.   You may shower in 24 hours, after removing the bandage.   Do not submerge the procedure site for 1 week (no bathtub, swimming, hot tub, etc.)   Do not use ointments, creams, or powders on the puncture site.   Be sure your hands are clean when touching near the site.   Inspect the site daily.??      DIET/MEDICATIONS:   Jeremy Burnett may resume your previous diet after the procedure.   Drink at least 8 glasses of water a day for the next 24-48 hours (unless contraindicated by kidney or heart disease).  This will flush the x-ray contrast through your kidneys.     If you receive sedation or narcotic pain medications, avoid any foods or beverages containing alcohol for at least 24 hours after the procedure.   Please refer to your After Visit Summary for instructions regarding resuming home medications.??      SYMPTOMS YOU MAY NOTICE   You may experience itching, redness, or skin discoloration over the treated area.    It is normal to have a low-grade fever less than 101.24F (38.6C) for 4-7 days. Please call if you develop a fever over 101.24F (38.6C).   Tingling, numbness, burning, prickling, itching, or ?pins and needles? feeling to the bottom of your foot.     You may have some tenderness and/or bruising around the puncture site initially. Bruising can take 2-3 weeks to go away completely.        WHEN TO CALL THE DOCTOR:? ? ? ??   If you have significant bleeding (more than a teaspoon) or swelling at the site (bigger than a golf ball), lie down, apply firm pressure to the site and call 911. Bleeding from a large vessel requires professional help.   If you have severe pain unrelieved by medication.? It is common to have mild soreness or slight swelling at the puncture site for 1-2 weeks.   If you have numbness, tingling, or weakness in the leg below the puncture site or your leg becomes cold and pale.   If you are unable to urinate (and do not have a urinary catheter in place).   If you have persistent nausea or vomiting.   You have signs of infection such as: fever greater than 101F, chills, redness, warmth, swelling, drainage, or pus from the puncture site.      For any of the above symptoms or for problems or concerns related to the procedure that was performed at the [facility], [place], call [phone] Monday-Friday from  7-5p.? After-hours and weekends, please call [phone] and ask for the Interventional Radiology Resident on-call.      YOU OR YOUR CAREGIVER SHOULD CALL 911 FOR ANY SEVERE SYMPTOMS SUCH AS EXCESSIVE BLEEDING, SEVERE DIZZINESS, TROUBLE BREATHING OR LOSS OF CONSCIOUSNESS.

## 2024-02-21 NOTE — Telephone Encounter
 GAE being reschedule due to insurance appeal process. Patient agreeable to 7/23 at 1200. Will send updated procedure information to My Chart.    Lauraine Hence, RN BSN

## 2024-02-29 ENCOUNTER — Encounter: Admit: 2024-02-29 | Discharge: 2024-02-29 | Payer: MEDICARE

## 2024-03-01 ENCOUNTER — Encounter: Admit: 2024-03-01 | Discharge: 2024-03-01 | Payer: MEDICARE

## 2024-03-01 ENCOUNTER — Ambulatory Visit: Admit: 2024-03-01 | Discharge: 2024-03-02 | Payer: MEDICARE

## 2024-03-11 ENCOUNTER — Encounter: Admit: 2024-03-11 | Discharge: 2024-03-11 | Payer: MEDICARE

## 2024-03-22 NOTE — Progress Notes
 Telehealth Visit Note    Date of Service: 03/26/2024    Subjective:           Jeremy Burnett. is a 58 y.o. male.    History of Present Illness  I had the pleasure of seeing Jeremy Burnett in clinic for OSA follow-up. He is known to Dr. Launie. He has moderate OSA (AHI 23.9) and is on CPAP 5-15.   He says CPAP is going ok, other times it acts up  He is wearing a hybrid full face mask. Changing out every month   Straps are snug enough that they leave marks on his face   Always has dry mouth   He is doing ok with therapy   Ambien  10mg  as needed, can really make him groggy    If he has trouble sleeping, he will go to the recliner and sleep there     Still smoking, half pack a day  No shortness of breath, states SpO2 96% this morning   Low dose lung cancer screening in [place]   Will see PCP next week     Working on possible knee surgery        03/25/2024    12:46 PM 09/21/2023     6:18 AM 03/27/2023    12:20 PM 01/02/2023     7:29 PM   Epworth Sleepiness Scale   Sitting and reading 2 1 0  1   Watching TV 1 1 0  1   Sitting inactive in a public place (e.g. a theater or a meeting) 0 0 0  1   As a passenger in a car for an hour without a break 0 1 0  1   Lying down to rest in the afternoon when circumstances permit 1 2 3  3    Sitting and talking to someone 0 0 0  0   Sitting quietly after a lunch without alcohol 0 1 0  0   In a car, while stopped for a few minutes in traffic 0 0 0  0   Epworth Sleepiness Scale Score 4  6 3 7        Patient-reported    Proxy-reported                     Objective:          acetylcysteine  (NAC) 600 mg capsule Take 3 capsules immediately after IV contrast, and then 3 capsules daily for 2 days following contrast    aspirin  EC 81 mg tablet Take one tablet by mouth every morning.    atorvastatin  (LIPITOR) 40 mg tablet Take one tablet by mouth daily.    CINNAMON BARK PO Take 385 mg by mouth daily.    clotrimazole-betamethasone (LOTRISONE) 1-0.05 % topical cream Apply  topically to affected area twice daily.    cyclobenzaprine (FLEXERIL) 10 mg tablet Take one tablet by mouth three times daily as needed.    diclofenac  sodium (VOLTAREN ) 1 % topical gel Apply  topically to affected area as Needed.    duloxetine  DR (CYMBALTA ) 30 mg capsule Take one capsule by mouth daily.    Garlic 1,000 mg cap Take one capsule by mouth daily.    HYDROcodone /acetaminophen  (NORCO) 10/325 mg tablet Take one tablet by mouth every 6 hours as needed for Pain.    insulin  glargine (LANTUS  U-100 INSULIN ) 100 unit/mL subcutaneous solution Inject sixty Units under the skin at bedtime daily.    isosorbide  mononitrate ER (IMDUR ) 60 mg ER  tablet Take one tablet by mouth every morning.    levothyroxine  (SYNTHROID ) 25 mcg tablet Take one tablet by mouth daily 30 minutes before breakfast.    lidocaine (LIDODERM) 5 % topical patch Apply one patch topically to affected area as Needed.    lisinopril  (PRINIVIL ; ZESTRIL ) 10 mg tablet Take one tablet by mouth daily.    metFORMIN (GLUCOPHAGE) 1,000 mg tablet Take one tablet by mouth three times daily.    metoprolol  succinate XL (TOPROL  XL) 25 mg extended release tablet Take one tablet by mouth daily.    nitroglycerin  (NITRO-DUR ) 0.2 mg/hr patch Apply one patch to top of skin as directed daily. 12 hours on, then 12 hours off.    NOVOLIN R REGULAR U100 INSULIN  100 unit/mL injection solution Inject thirty Units under the skin twice daily.    Potassium Gluconate 595 mg (99 mg) tab Take 99 mg by mouth daily.    triamcinolone acetonide (KENALOG) 0.1 % topical cream Apply  topically to affected area twice daily.    triamterene -hydrochlorothiazide (DYAZIDE ) 37.5-25 mg capsule Take one capsule by mouth every morning.    turmeric root extract 500 mg tab Take one tablet by mouth daily.    zolpidem  (AMBIEN ) 10 mg tablet Take one tablet by mouth at bedtime as needed for Sleep.          Telehealth Patient Reported Vitals       Row Name 03/26/24 0904                Pain Score SIX        Pain Loc GENERALIZED Telehealth Body Mass Index: [id number] at 03/26/2024  9:32 AM    Physical Exam  Constitutional:       Appearance: Normal appearance.   Pulmonary:      Effort: Pulmonary effort is normal. No respiratory distress.   Skin:     General: Skin is dry.   Neurological:      Mental Status: Jeremy Burnett is alert and oriented to person, place, and time.   Psychiatric:         Mood and Affect: Mood normal.         Behavior: Behavior normal.         Thought Content: Thought content normal.         Judgment: Judgment normal.                Assessment and Plan:    Problem   Obstructive Sleep Apnea Syndrome    HST 10/07/2022  BMI 47 318lbs  AHI of 23.9 supine 24.6  O2 low 69.9%, time < 88% 21 minutes.    Pt set up with cPAP @ 5-15cm H2O on 01/05/23.  DME: [facility]  Insurance: [employer]   If the patient has to meet compliance, their compliance window will be from 02/05/23 to 04/05/23.     Oximetry 10/11/2023  On CPAP 5-15  Below 88% for 5.5 minutes  Oxygen Nadir 84%     Primary Insomnia    On Ambien  10 mg qhs prn           Obstructive sleep apnea syndrome  Reviewed CPAP download. 16/30 days of usage.   Wearing 4 hours or greater 53% of nights. The average use on days used was 6 hours and 56 minutes.  Residual AHI 2.0.    The 95th percentile pressure was 13.6 cmH20, with a maximum of 14.5 cmH20, and a median of 11.6 cmH20.  The median leak was  48.1 L/min. He is tolerating and benefiting from therapy.     - Will lower pressure for comfort to 5-12. Chinstrap ordered. Encouraged him to wear CPAP more consistently.      - Keep in contact with DME for routine supplies, need to contact us  directly if unable to communicate with DME in a timely manner.     Primary insomnia  Ambien  as needed, PCP prescribing. Encouraged him to split in half.     He has our contact information and was encouraged to call us  with any questions or concerns.    RTC in one year     Orders Placed This Encounter    AMB REFERRAL TO HOME CARE 15 minutes spent on this patient's encounter with counseling and coordination of care taking >50% of the visit.

## 2024-03-26 ENCOUNTER — Ambulatory Visit: Admit: 2024-03-26 | Discharge: 2024-03-27 | Payer: MEDICARE

## 2024-03-26 ENCOUNTER — Encounter: Admit: 2024-03-26 | Discharge: 2024-03-26 | Payer: MEDICARE

## 2024-03-26 DIAGNOSIS — G4733 Obstructive sleep apnea (adult) (pediatric): Principal | ICD-10-CM

## 2024-03-26 DIAGNOSIS — F5101 Primary insomnia: Secondary | ICD-10-CM

## 2024-03-26 NOTE — Assessment & Plan Note
 Ambien  as needed, PCP prescribing. Encouraged him to split in half.

## 2024-03-26 NOTE — Assessment & Plan Note
 Reviewed CPAP download. 16/30 days of usage.   Wearing 4 hours or greater 53% of nights. The average use on days used was 6 hours and 56 minutes.  Residual AHI 2.0.    The 95th percentile pressure was 13.6 cmH20, with a maximum of 14.5 cmH20, and a median of 11.6 cmH20.  The median leak was 48.1 L/min. He is tolerating and benefiting from therapy.     - Will lower pressure for comfort to 5-12. Chinstrap ordered. Encouraged him to wear CPAP more consistently.      - Keep in contact with DME for routine supplies, need to contact us  directly if unable to communicate with DME in a timely manner.

## 2024-03-27 ENCOUNTER — Encounter: Admit: 2024-03-27 | Discharge: 2024-03-27 | Payer: MEDICARE

## 2024-05-01 ENCOUNTER — Encounter: Admit: 2024-05-01 | Discharge: 2024-05-01 | Payer: MEDICARE

## 2024-06-05 ENCOUNTER — Encounter: Admit: 2024-06-05 | Discharge: 2024-06-05 | Payer: MEDICARE

## 2024-06-10 ENCOUNTER — Encounter: Admit: 2024-06-10 | Discharge: 2024-06-10 | Payer: MEDICARE

## 2024-06-10 ENCOUNTER — Ambulatory Visit: Admit: 2024-06-10 | Discharge: 2024-06-10 | Payer: MEDICARE

## 2024-06-11 ENCOUNTER — Encounter: Admit: 2024-06-11 | Discharge: 2024-06-11 | Payer: MEDICARE

## 2024-06-12 ENCOUNTER — Encounter: Admit: 2024-06-12 | Discharge: 2024-06-12 | Payer: MEDICARE

## 2024-09-03 ENCOUNTER — Encounter: Admit: 2024-09-03 | Discharge: 2024-09-03 | Payer: MEDICARE

## 2024-09-13 ENCOUNTER — Encounter: Admit: 2024-09-13 | Discharge: 2024-09-13 | Payer: MEDICARE
